# Patient Record
Sex: Female | Born: 1945 | Race: White | Hispanic: No | Marital: Single | Smoking: Never smoker
Health system: Southern US, Community
[De-identification: ages and names within clinical notes are randomized; demographics above are authoritative.]

## PROBLEM LIST (undated history)

## (undated) DIAGNOSIS — N200 Calculus of kidney: Secondary | ICD-10-CM

## (undated) DIAGNOSIS — M797 Fibromyalgia: Secondary | ICD-10-CM

## (undated) DIAGNOSIS — I10 Essential (primary) hypertension: Secondary | ICD-10-CM

## (undated) DIAGNOSIS — K219 Gastro-esophageal reflux disease without esophagitis: Secondary | ICD-10-CM

## (undated) DIAGNOSIS — G231 Progressive supranuclear ophthalmoplegia [Steele-Richardson-Olszewski]: Secondary | ICD-10-CM

## (undated) HISTORY — DX: Essential (primary) hypertension: I10

## (undated) HISTORY — DX: Fibromyalgia: M79.7

## (undated) HISTORY — PX: ROTATOR CUFF REPAIR: SHX139

## (undated) HISTORY — DX: Calculus of kidney: N20.0

## (undated) HISTORY — DX: Gastro-esophageal reflux disease without esophagitis: K21.9

---

## 1980-05-08 DIAGNOSIS — N200 Calculus of kidney: Secondary | ICD-10-CM

## 1980-05-08 HISTORY — DX: Calculus of kidney: N20.0

## 1996-05-08 HISTORY — PX: NOSE SURGERY: SHX723

## 1998-05-08 HISTORY — PX: OTHER SURGICAL HISTORY: SHX169

## 2009-05-08 HISTORY — PX: OVARIAN CYST SURGERY: SHX726

## 2014-02-09 ENCOUNTER — Other Ambulatory Visit (HOSPITAL_COMMUNITY): Payer: Self-pay | Admitting: *Deleted

## 2014-02-09 DIAGNOSIS — M810 Age-related osteoporosis without current pathological fracture: Secondary | ICD-10-CM

## 2014-02-10 ENCOUNTER — Other Ambulatory Visit (HOSPITAL_COMMUNITY): Payer: Self-pay | Admitting: Family Medicine

## 2014-02-10 DIAGNOSIS — M810 Age-related osteoporosis without current pathological fracture: Secondary | ICD-10-CM

## 2014-02-11 ENCOUNTER — Ambulatory Visit (INDEPENDENT_AMBULATORY_CARE_PROVIDER_SITE_OTHER): Payer: Medicare PPO | Admitting: Family Medicine

## 2014-02-11 VITALS — BP 140/80 | HR 80 | Temp 98.1°F | Resp 16 | Ht 67.0 in | Wt 211.0 lb

## 2014-02-11 DIAGNOSIS — W19XXXA Unspecified fall, initial encounter: Secondary | ICD-10-CM

## 2014-02-11 DIAGNOSIS — Z23 Encounter for immunization: Secondary | ICD-10-CM

## 2014-02-11 DIAGNOSIS — S0181XA Laceration without foreign body of other part of head, initial encounter: Secondary | ICD-10-CM

## 2014-02-11 DIAGNOSIS — Y92009 Unspecified place in unspecified non-institutional (private) residence as the place of occurrence of the external cause: Secondary | ICD-10-CM

## 2014-02-11 DIAGNOSIS — Y92099 Unspecified place in other non-institutional residence as the place of occurrence of the external cause: Secondary | ICD-10-CM

## 2014-02-11 NOTE — Progress Notes (Signed)
The patient was anesthetized with 5 cc of 2% with epi.  The wound was vigorously scrubbed with soap and water.  The wound was then closed with four 6-0 simple interrupted sutures.  The wound was then cleaned with water and a bandage was applied. Patient instructed to come back for removal in 5 days.

## 2014-02-11 NOTE — Progress Notes (Signed)
Subjective: Patient tripped at about 11:00 today with her foot off the 8 to the pavement. She fell forward hitting the pavement. She has a laceration over her left eyebrow, primarily caused by the glasses. She has an abrasion on the forehead. She also has a little cut on her nose. No loss of consciousness.  Last tetanus shot was many years ago.  Objective: No major distress. Abrasion on forehead. 2 CM laceration above the medial aspect of the left eyebrow. 4-5 mm laceration on the bridge of the nose. No other injuries.  Assessment: Facial wounds  Plan: TDAP Laceration repair.

## 2014-02-11 NOTE — Patient Instructions (Signed)

## 2014-02-18 ENCOUNTER — Ambulatory Visit (INDEPENDENT_AMBULATORY_CARE_PROVIDER_SITE_OTHER): Payer: Medicare PPO | Admitting: Emergency Medicine

## 2014-02-18 VITALS — BP 130/72 | HR 76 | Temp 98.8°F | Resp 16

## 2014-02-18 DIAGNOSIS — Z4802 Encounter for removal of sutures: Secondary | ICD-10-CM

## 2014-02-18 DIAGNOSIS — S0181XD Laceration without foreign body of other part of head, subsequent encounter: Secondary | ICD-10-CM

## 2014-02-18 NOTE — Patient Instructions (Signed)

## 2014-02-18 NOTE — Progress Notes (Signed)
   Subjective:    Patient ID: Heather Brady, female    DOB: 1945-07-12, 68 y.o.   MRN: [id number]  HPI  This is a 68 year old female here for suture removal. She sustained a laceration to her face 7 days ago. She is not having any problems - no dizziness, blurred vision, excessive pain or drainage.  Review of Systems  Constitutional: Negative.   Eyes: Negative for visual disturbance.  Skin: Positive for wound.  Neurological: Negative for dizziness, syncope and headaches.       Objective:   Physical Exam  Constitutional: She is oriented to person, place, and time. She appears well-developed and well-nourished. No distress.  HENT:  Head: Normocephalic.    Abrasion over nose and a healed laceration about left brow.  Eyes: Conjunctivae, EOM and lids are normal. Right eye exhibits no discharge. Left eye exhibits no discharge. No scleral icterus.  Pulmonary/Chest: Effort normal. No respiratory distress.  Neurological: She is alert and oriented to person, place, and time.  Skin: Skin is warm and dry. Laceration noted. She is not diaphoretic.  2 cm healed laceration above right brow.  Psychiatric: She has a normal mood and affect. Her speech is normal and behavior is normal. Thought content normal.    Procedure: wound is healing well. #4 sutures removed. Wound care discussed.      Assessment & Plan:  1. Laceration of face, subsequent encounter Wound healing well. #4 sutures removed. Wound care discussed. She will follow up PRN.   Roswell MinersNicole V. Dyke BrackettBush, PA-C, MHS [facility]  02/18/2014

## 2014-02-19 ENCOUNTER — Other Ambulatory Visit (HOSPITAL_COMMUNITY): Payer: Self-pay | Admitting: Family Medicine

## 2014-02-19 DIAGNOSIS — Z1231 Encounter for screening mammogram for malignant neoplasm of breast: Secondary | ICD-10-CM

## 2014-03-09 ENCOUNTER — Ambulatory Visit (HOSPITAL_COMMUNITY)
Admission: RE | Admit: 2014-03-09 | Discharge: 2014-03-09 | Disposition: A | Discharge status: Home / Self Care | Payer: Medicare PPO | Source: Ambulatory Visit | Attending: *Deleted | Admitting: *Deleted

## 2014-03-09 ENCOUNTER — Ambulatory Visit (HOSPITAL_COMMUNITY)
Admission: RE | Admit: 2014-03-09 | Discharge: 2014-03-09 | Disposition: A | Discharge status: Home / Self Care | Payer: Medicare PPO | Source: Ambulatory Visit | Attending: Family Medicine | Admitting: Family Medicine

## 2014-03-09 DIAGNOSIS — Z13820 Encounter for screening for osteoporosis: Secondary | ICD-10-CM | POA: Insufficient documentation

## 2014-03-09 DIAGNOSIS — Z780 Asymptomatic menopausal state: Secondary | ICD-10-CM | POA: Insufficient documentation

## 2014-03-09 DIAGNOSIS — Z1231 Encounter for screening mammogram for malignant neoplasm of breast: Secondary | ICD-10-CM | POA: Insufficient documentation

## 2014-03-09 DIAGNOSIS — M810 Age-related osteoporosis without current pathological fracture: Secondary | ICD-10-CM

## 2014-03-12 ENCOUNTER — Other Ambulatory Visit: Payer: Self-pay | Admitting: Family Medicine

## 2014-03-12 DIAGNOSIS — R928 Other abnormal and inconclusive findings on diagnostic imaging of breast: Secondary | ICD-10-CM

## 2014-03-25 ENCOUNTER — Other Ambulatory Visit: Payer: Medicare PPO

## 2014-03-26 ENCOUNTER — Ambulatory Visit
Admission: RE | Admit: 2014-03-26 | Discharge: 2014-03-26 | Disposition: A | Discharge status: Home / Self Care | Payer: Medicare PPO | Source: Ambulatory Visit | Attending: Family Medicine | Admitting: Family Medicine

## 2014-03-26 ENCOUNTER — Encounter (INDEPENDENT_AMBULATORY_CARE_PROVIDER_SITE_OTHER): Payer: Self-pay

## 2014-03-26 DIAGNOSIS — R928 Other abnormal and inconclusive findings on diagnostic imaging of breast: Secondary | ICD-10-CM

## 2014-10-08 ENCOUNTER — Other Ambulatory Visit (HOSPITAL_COMMUNITY): Payer: Self-pay | Admitting: Family Medicine

## 2014-10-08 ENCOUNTER — Ambulatory Visit (INDEPENDENT_AMBULATORY_CARE_PROVIDER_SITE_OTHER): Payer: Medicare PPO | Admitting: Emergency Medicine

## 2014-10-08 VITALS — BP 180/100 | HR 86 | Temp 98.4°F | Resp 18 | Ht 66.0 in | Wt 200.0 lb

## 2014-10-08 DIAGNOSIS — R270 Ataxia, unspecified: Secondary | ICD-10-CM | POA: Diagnosis not present

## 2014-10-08 DIAGNOSIS — S0012XA Contusion of left eyelid and periocular area, initial encounter: Secondary | ICD-10-CM

## 2014-10-08 DIAGNOSIS — S0081XA Abrasion of other part of head, initial encounter: Secondary | ICD-10-CM | POA: Diagnosis not present

## 2014-10-08 NOTE — Patient Instructions (Signed)

## 2014-10-08 NOTE — Progress Notes (Signed)
Subjective:  Patient ID: Heather Brady, female    DOB: [birth year]  Age: 69 y.o. MRN: [id number]  CC: Fall and Eye Pain   HPI Heather Brady presents  for evaluation following a fall. She is under evaluation by her family doctor following repeatedly falling at home. Thus far she's only fallen in the presence of other people. On Tuesday she tripped and fell and struck her forehead and left eye on the carpet. She has a periorbital ecchymosis on the left and an abrasion on her left forehead. She had no loss of consciousness. Has no new neurologic or visual symptoms.  She has no improvement with over-the-counter medication. She denies any other injury.  Outpatient Prescriptions Prior to Visit  Medication Sig Dispense Refill  . amLODipine (NORVASC) 10 MG tablet Take 10 mg by mouth daily.    . Ergocalciferol (VITAMIN D2 PO) Take by mouth.    . esomeprazole (NEXIUM) 40 MG capsule Take 40 mg by mouth daily at 12 noon.    Marland Kitchen. HYDROcodone-acetaminophen (NORCO/VICODIN) 5-325 MG per tablet Take 1 tablet by mouth every 6 (six) hours as needed for moderate pain.    Marland Kitchen. losartan (COZAAR) 25 MG tablet Take 25 mg by mouth daily.    Marland Kitchen. rOPINIRole (REQUIP) 2 MG tablet Take 2 mg by mouth at bedtime.     No facility-administered medications prior to visit.    History   Social History  . Marital Status: Single    Spouse Name: N/A  . Number of Children: N/A  . Years of Education: N/A   Social History Main Topics  . Smoking status: Never Smoker   . Smokeless tobacco: Not on file  . Alcohol Use: Not on file  . Drug Use: Not on file  . Sexual Activity: Not on file   Other Topics Concern  . None   Social History Narrative    History reviewed. No pertinent family history.  Past Medical History  Diagnosis Date  . Hypertension   . Fibromyalgia      Review of Systems  Constitutional: Negative for fever, chills and appetite change.  HENT: Negative for congestion, ear pain, postnasal drip, sinus  pressure and sore throat.   Eyes: Negative for pain and redness.  Respiratory: Negative for cough, shortness of breath and wheezing.   Cardiovascular: Negative for leg swelling.  Gastrointestinal: Negative for nausea, vomiting, abdominal pain, diarrhea, constipation and blood in stool.  Endocrine: Negative for polyuria.  Genitourinary: Negative for dysuria, urgency, frequency and flank pain.  Musculoskeletal: Negative for gait problem.  Skin: Negative for rash.  Neurological: Positive for dizziness. Negative for weakness and headaches.  Psychiatric/Behavioral: Negative for confusion and decreased concentration. The patient is not nervous/anxious.     Objective:  BP 180/100 mmHg  Pulse 86  Temp(Src) 98.4 F (36.9 C) (Oral)  Resp 18  Ht 5\' 6"  (1.676 m)  Wt 200 lb (90.719 kg)  BMI 32.30 kg/m2  SpO2 97%  BP Readings from Last 3 Encounters:  10/08/14 180/100  02/18/14 130/72  02/11/14 140/80    Wt Readings from Last 3 Encounters:  10/08/14 200 lb (90.719 kg)  02/11/14 211 lb (95.709 kg)    Physical Exam  Constitutional: She is oriented to person, place, and time. She appears well-developed and well-nourished.  HENT:  Head: Normocephalic. Head is with raccoon's eyes and with abrasion.  Eyes: Conjunctivae are normal. Pupils are equal, round, and reactive to light.  Pulmonary/Chest: Effort normal.  Musculoskeletal: She exhibits no edema.  Neurological: She is alert and oriented to person, place, and time. Coordination abnormal.  Skin: Skin is dry. Abrasion and bruising noted.  Psychiatric: She has a normal mood and affect. Her behavior is normal. Thought content normal.   There is no hyphema. She is ataxic and is unable to do tandem gait satisfactorily. She says this is nothing new but if something that is known from previously.   No results found for: WBC, HGB, HCT, PLT, GLUCOSE, CHOL, TRIG, HDL, LDLDIRECT, LDLCALC, ALT, AST, NA, K, CL, CREATININE, BUN, CO2, TSH, PSA, INR,  GLUF, HGBA1C, MICROALBUR    .  Assessment & Plan:   Heather Brady was seen today for fall and eye pain.  Diagnoses and all orders for this visit:  Abrasion of face, initial encounter  Periorbital ecchymosis, left, initial encounter  Ataxia   I am having Heather Brady maintain her esomeprazole, Ergocalciferol (VITAMIN D2 PO), HYDROcodone-acetaminophen, rOPINIRole, amLODipine, and losartan.  No orders of the defined types were placed in this encounter.    follow-up with her regular doctor and complete the scheduled MRI and referral to neurology.    Appropriate red flag conditions were discussed with the patient as well as actions that should be taken.  Patient expressed his understanding.  Follow-up: Return if symptoms worsen or fail to improve.  Heather Dane, MD

## 2014-10-19 ENCOUNTER — Ambulatory Visit (HOSPITAL_COMMUNITY)
Admission: RE | Admit: 2014-10-19 | Discharge: 2014-10-19 | Disposition: A | Discharge status: Home / Self Care | Payer: Medicare PPO | Source: Ambulatory Visit | Attending: Family Medicine | Admitting: Family Medicine

## 2014-10-19 DIAGNOSIS — W19XXXA Unspecified fall, initial encounter: Secondary | ICD-10-CM | POA: Diagnosis not present

## 2014-10-19 DIAGNOSIS — R270 Ataxia, unspecified: Secondary | ICD-10-CM | POA: Diagnosis present

## 2014-10-19 DIAGNOSIS — G319 Degenerative disease of nervous system, unspecified: Secondary | ICD-10-CM | POA: Insufficient documentation

## 2014-10-19 MED ORDER — GADOBENATE DIMEGLUMINE 529 MG/ML IV SOLN
20.0000 mL | Freq: Once | INTRAVENOUS | Status: AC
  Administered 2014-10-19: 19 mL via INTRAVENOUS

## 2014-10-20 ENCOUNTER — Encounter: Payer: Self-pay | Admitting: Diagnostic Neuroimaging

## 2014-10-20 ENCOUNTER — Ambulatory Visit (INDEPENDENT_AMBULATORY_CARE_PROVIDER_SITE_OTHER): Payer: Medicare PPO | Admitting: Diagnostic Neuroimaging

## 2014-10-20 VITALS — BP 108/67 | HR 83 | Ht 66.0 in | Wt 200.6 lb

## 2014-10-20 DIAGNOSIS — R42 Dizziness and giddiness: Secondary | ICD-10-CM | POA: Diagnosis not present

## 2014-10-20 DIAGNOSIS — R269 Unspecified abnormalities of gait and mobility: Secondary | ICD-10-CM | POA: Diagnosis not present

## 2014-10-20 DIAGNOSIS — G20 Parkinson's disease: Secondary | ICD-10-CM | POA: Diagnosis not present

## 2014-10-20 NOTE — Progress Notes (Signed)
[facility]  PATIENT: Heather Brady DOB: 11-07-1945  REFERRING CLINICIAN: Nymberg  HISTORY FROM: patient  REASON FOR VISIT: new consult    HISTORICAL  CHIEF COMPLAINT:  Chief Complaint  Patient presents with  . ataxia    New Patient, rm 7    HISTORY OF PRESENT ILLNESS:   69 year old right-handed female here for evaluation of gait and balance difficulty. For past 1 year patient has had increasing problems with balance or walking, controlling her feet, resulting in at least 4 falls. Most recent fall was several weeks ago, where she fell forward without warning. She wanted to walk forward. Her feet were stuck in left behind. She struck her left forehead and has left periorbital swelling and ecchymoses.  Patient also has difficulty getting up from a chair. She has been more cautious and has stopped walking as far she used to. In 2012 she was walking 5 miles per day, as well as doing country dancing, and very active. 2014 she decreased her walking to 3 miles per day. By 2015 she had stopped walking for daily exercise. She is very fearful of falling and barely walks at all.  Patient also has intermittent numbness and tingling in hands or feet for past 10 years. 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   REVIEW OF SYSTEMS: Full 14 system review of systems performed and notable only for insomnia restless legs weakness constipation allergies runny nose aching muscles weight gain swelling in legs moles.   ALLERGIES: Allergies  Allergen Reactions  . Amoxicillin Itching    HOME MEDICATIONS: Outpatient Prescriptions Prior to Visit  Medication Sig Dispense Refill  . amLODipine (NORVASC) 10 MG tablet Take 10 mg by mouth daily.    Marland Kitchen esomeprazole (NEXIUM) 40 MG capsule Take 40 mg by mouth daily at 12 noon.    Marland Kitchen HYDROcodone-acetaminophen (NORCO/VICODIN) 5-325 MG per tablet Take 1 tablet by mouth every 6 (six) hours as needed for  moderate pain.    . Ergocalciferol (VITAMIN D2 PO) Take by mouth.    . losartan (COZAAR) 25 MG tablet Take 25 mg by mouth daily.    Marland Kitchen rOPINIRole (REQUIP) 2 MG tablet Take 2 mg by mouth at bedtime.     No facility-administered medications prior to visit.    PAST MEDICAL HISTORY: Past Medical History  Diagnosis Date  . Hypertension   . Fibromyalgia   . GERD (gastroesophageal reflux disease)   . Kidney stones 1982    PAST SURGICAL HISTORY: Past Surgical History  Procedure Laterality Date  . Ovarian cyst surgery  2011  . Nose surgery  1998    reconstruction  . Carpel tunnel  2000    right    FAMILY HISTORY: Family History  Problem Relation Age of Onset  . Healthy Mother   . Healthy Father     SOCIAL HISTORY:  History   Social History  . Marital Status: Single    Spouse Name: N/A  . Number of Children: 2  . Years of Education: 13   Occupational History  . retired     Product/process development scientist   Social History Main Topics  . Smoking status: Never Smoker   . Smokeless tobacco: Not on file  . Alcohol Use: No  . Drug Use: No  . Sexual Activity: Not on file   Other Topics Concern  . Not on file   Social History Narrative   Lives at home alone   Drinks 16 oz tea daily  PHYSICAL EXAM   GENERAL EXAM/CONSTITUTIONAL: Vitals:  Filed Vitals:   10/20/14 1414  BP: 108/67  Pulse: 83  Height:  (1.676 m)  Weight: 200 lb 9.6 oz (90.992 kg)     Body mass index is 32.39 kg/(m^2).  Visual Acuity Screening   Right eye Left eye Both eyes  Without correction:     With correction: 20/70 20/50      Patient is in no distress; well developed, nourished and groomed; neck is supple  CARDIOVASCULAR:  Examination of carotid arteries is normal; no carotid bruits  Regular rate and rhythm, no murmurs  Examination of peripheral vascular system by observation and palpation is normal  EYES:  Ophthalmoscopic exam of optic discs and posterior segments is  normal; no papilledema or hemorrhages  MUSCULOSKELETAL:  Gait, strength, tone, movements noted in Neurologic exam below  NEUROLOGIC: MENTAL STATUS:  No flowsheet data found.  awake, alert, oriented to person, place and time  recent and remote memory intact  normal attention and concentration  language fluent, comprehension intact, naming intact,   fund of knowledge appropriate  CRANIAL NERVE:   2nd - no papilledema on fundoscopic exam  2nd, 3rd, 4th, 6th - pupils equal and reactive to light, visual fields full to confrontation, extraocular muscles intact, no nystagmus  5th - facial sensation symmetric  7th - facial strength symmetric  8th - hearing intact  9th - palate elevates symmetrically, uvula midline  11th - shoulder shrug symmetric  12th - tongue protrusion midline  VOICE HOARSE, SOFT  MOTOR:   normal bulk; INCREASED COGWHEELING RIGIDITY IN BUE WITH CONTRALATERAL REINFORCEMENT; MODERATE BRADYKINESIA (LEFT WORSE THAN RIGHT IN UPPER AND LOWER EXT; LOWER EXT WORSE THAN UPPER EXT; full strength in the BUE, BLE  SENSORY:   normal and symmetric to light touch, pinprick, temperature; VIBRATION 6 SEC AT TOES  COORDINATION:   finger-nose-finger, fine finger movements normal  REFLEXES:   deep tendon reflexes: BUE 3, KNEES 2, RIGHT ANKLE 0, LEFT ANKLE 1; POSITIVE SNOUT; BORDERLINE MYERSONS  GAIT/STATION:   narrow based gait; NEEDS ARMS TO HELP STAND UP; SLOW, SHORT STEPS, UNSTEADY, EN BLOC TURNING; DECR ARM SWING; CANNOT TANDEM, TOE OR HEEL WALK; romberg is negative    DIAGNOSTIC DATA (LABS, IMAGING, TESTING) - I reviewed patient records, labs, notes, testing and imaging myself where available.  No results found for: WBC, HGB, HCT, MCV, PLT No results found for: NA, K, CL, CO2, GLUCOSE, BUN, CREATININE, CALCIUM, PROT, ALBUMIN, AST, ALT, ALKPHOS, BILITOT, GFRNONAA, GFRAA No results found for: CHOL, HDL, LDLCALC, LDLDIRECT, TRIG, CHOLHDL No results found  for: HGBA1C No results found for: VITAMINB12 No results found for: TSH   10/19/14 MRI brain [I reviewed images myself and agree with interpretation. -VRP]  1. No acute intracranial abnormality or mass. 2. Mild chronic small vessel ischemic disease and cerebral atrophy.     ASSESSMENT AND PLAN  69 y.o. year old female here with progressive gait and balance difficulty, with cogwheel rigidity, bradykinesia, masked facies, positive snout and Myerson reflexes. Signs and symptoms are concerning for parkinsonism.   Dx:  Gait difficulty - Plan: MR Cervical Spine Wo Contrast, For home use only DME 4 wheeled rolling walker with seat, Ambulatory referral to Physical Therapy  Parkinsonism - Plan: MR Cervical Spine Wo Contrast, For home use only DME 4 wheeled rolling walker with seat, Ambulatory referral to Physical Therapy  Dizziness and giddiness - Plan: MR Cervical Spine Wo Contrast, For home use only DME 4 wheeled rolling walker  with seat, Ambulatory referral to Physical Therapy    PLAN: - MRI cervical --> rule out cervical myelopathy - rollator walker - PT evaluation - may consider carbidopa/levodopa at next visit  Orders Placed This Encounter  Procedures  . For home use only DME 4 wheeled rolling walker with seat  . MR Cervical Spine Wo Contrast  . Ambulatory referral to Physical Therapy   Return in about 6 weeks (around 12/01/2014).    Suanne Marker, MD 10/20/2014, 3:40 PM Certified in Neurology, Neurophysiology and Neuroimaging  [facility] [address] [place], [place] [address] [phone]

## 2014-10-20 NOTE — Patient Instructions (Signed)
I will check MRI cervical spine.  I will setup physical therapy and rollator walker.

## 2014-11-03 ENCOUNTER — Ambulatory Visit
Admission: RE | Admit: 2014-11-03 | Discharge: 2014-11-03 | Disposition: A | Discharge status: Home / Self Care | Payer: Medicare PPO | Source: Ambulatory Visit | Attending: Diagnostic Neuroimaging | Admitting: Diagnostic Neuroimaging

## 2014-11-03 DIAGNOSIS — R269 Unspecified abnormalities of gait and mobility: Secondary | ICD-10-CM

## 2014-11-03 DIAGNOSIS — G20 Parkinson's disease: Secondary | ICD-10-CM

## 2014-11-03 DIAGNOSIS — R42 Dizziness and giddiness: Secondary | ICD-10-CM

## 2014-11-24 ENCOUNTER — Ambulatory Visit: Payer: Medicare PPO | Admitting: Diagnostic Neuroimaging

## 2014-11-25 ENCOUNTER — Encounter: Payer: Self-pay | Admitting: Diagnostic Neuroimaging

## 2014-11-25 ENCOUNTER — Ambulatory Visit (INDEPENDENT_AMBULATORY_CARE_PROVIDER_SITE_OTHER): Payer: Medicare PPO | Admitting: Diagnostic Neuroimaging

## 2014-11-25 VITALS — BP 139/82 | HR 82 | Ht 66.0 in | Wt 199.8 lb

## 2014-11-25 DIAGNOSIS — M4712 Other spondylosis with myelopathy, cervical region: Secondary | ICD-10-CM

## 2014-11-25 DIAGNOSIS — M47812 Spondylosis without myelopathy or radiculopathy, cervical region: Secondary | ICD-10-CM

## 2014-11-25 MED ORDER — CARBIDOPA-LEVODOPA 25-100 MG PO TABS: 1.0000 | ORAL_TABLET | Freq: Three times a day (TID) | ORAL | Status: DC

## 2014-11-25 NOTE — Progress Notes (Signed)
[facility]  PATIENT: Heather Brady DOB: 06/07/1945  REFERRING CLINICIAN: Nymberg  HISTORY FROM: patient  REASON FOR VISIT: FOLLOW UP   HISTORICAL  CHIEF COMPLAINT:  Chief Complaint  Patient presents with  . Gait difficulty    rm 6, review MRI  . Follow-up    HISTORY OF PRESENT ILLNESS:   UPDATE 11/25/14: Since last visit, doing about the same. MRI c-spine reviewed. Not heard from PT yet. No more falls. Stays at home mainly.  PRIOR HPI (10/20/14): 69 year old right-handed female here for evaluation of gait and balance difficulty. For past 1 year patient has had increasing problems with balance or walking, controlling her feet, resulting in at least 4 falls. Most recent fall was several weeks ago, where she fell forward without warning. She wanted to walk forward. Her feet were stuck in left behind. She struck her left forehead and has left periorbital swelling and ecchymoses. Patient also has difficulty getting up from a chair. She has been more cautious and has stopped walking as far she used to. In 2012 she was walking 5 miles per day, as well as doing country dancing, and very active. 2014 she decreased her walking to 3 miles per day. By 2015 she had stopped walking for daily exercise. She is very fearful of falling and barely walks at all. Patient also has intermittent numbness and tingling in hands or feet for past 10 years. 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   REVIEW OF SYSTEMS: Full 14 system review of systems performed and notable only for excessive thirst constipation walking diff insomnia.     ALLERGIES: Allergies  Allergen Reactions  . Amoxicillin Itching    HOME MEDICATIONS: Outpatient Prescriptions Prior to Visit  Medication Sig Dispense Refill  . amLODipine (NORVASC) 10 MG tablet Take 10 mg by mouth daily.    Marland Kitchen. esomeprazole (NEXIUM) 40 MG capsule Take 40 mg by mouth daily at 12 noon.    Marland Kitchen.  HYDROcodone-acetaminophen (NORCO/VICODIN) 5-325 MG per tablet Take 1 tablet by mouth every 6 (six) hours as needed for moderate pain.    Marland Kitchen. ibuprofen (ADVIL,MOTRIN) 100 MG chewable tablet Chew by mouth every 8 (eight) hours as needed.    Marland Kitchen. levothyroxine (SYNTHROID, LEVOTHROID) 75 MCG tablet     . naproxen sodium (ANAPROX) 220 MG tablet Take 220 mg by mouth 2 (two) times daily with a meal.    . Vitamin D, Ergocalciferol, (DRISDOL) 50000 UNITS CAPS capsule TAKE ONE CAPSULE BY MOUTH EVERY 2 WEEKS  5  . losartan (COZAAR) 50 MG tablet      No facility-administered medications prior to visit.    PAST MEDICAL HISTORY: Past Medical History  Diagnosis Date  . Hypertension   . Fibromyalgia   . GERD (gastroesophageal reflux disease)   . Kidney stones 1982    PAST SURGICAL HISTORY: Past Surgical History  Procedure Laterality Date  . Ovarian cyst surgery  2011  . Nose surgery  1998    reconstruction  . Carpel tunnel  2000    right    FAMILY HISTORY: Family History  Problem Relation Age of Onset  . Healthy Mother   . Healthy Father     SOCIAL HISTORY:  History   Social History  . Marital Status: Single    Spouse Name: N/A  . Number of Children: 2  . Years of Education: 13   Occupational History  . retired     Product/process development scientistcafeteria supervisor   Social History  Main Topics  . Smoking status: Never Smoker   . Smokeless tobacco: Not on file  . Alcohol Use: No  . Drug Use: No  . Sexual Activity: Not on file   Other Topics Concern  . Not on file   Social History Narrative   Lives at home alone   Drinks 16 oz tea daily     PHYSICAL EXAM   GENERAL EXAM/CONSTITUTIONAL: Vitals:  Filed Vitals:   11/25/14 1434  BP: 139/82  Pulse: 82  Height: 5\' 6"  (1.676 m)  Weight: 199 lb 12.8 oz (90.629 kg)   Body mass index is 32.26 kg/(m^2). No exam data present  Patient is in no distress; well developed, nourished and groomed; neck is supple  CARDIOVASCULAR:  Examination of carotid  arteries is normal; no carotid bruits  Regular rate and rhythm, no murmurs  Examination of peripheral vascular system by observation and palpation is normal   MUSCULOSKELETAL:  Gait, strength, tone, movements noted in Neurologic exam below  NEUROLOGIC: MENTAL STATUS:  No flowsheet data found.  awake, alert, oriented to person, place and time  recent and remote memory intact  normal attention and concentration  language fluent, comprehension intact, naming intact,   fund of knowledge appropriate  CRANIAL NERVE:   2nd - no papilledema on fundoscopic exam  2nd, 3rd, 4th, 6th - pupils equal and reactive to light, visual fields full to confrontation, extraocular muscles intact, no nystagmus  5th - facial sensation symmetric  7th - facial strength symmetric  8th - hearing intact  9th - palate elevates symmetrically, uvula midline  11th - shoulder shrug symmetric  12th - tongue protrusion midline  VOICE HOARSE, SOFT  MOTOR:   normal bulk; INCREASED TONE IN BUE WITH CONTRALATERAL REINFORCEMENT; MODERATE BRADYKINESIA (LEFT WORSE THAN RIGHT IN UPPER AND LOWER EXT; LOWER EXT WORSE THAN UPPER EXT; full strength in the BUE, BLE  SENSORY:   normal and symmetric to light touch, pinprick, temperature; VIBRATION 6 SEC AT TOES  COORDINATION:   finger-nose-finger, fine finger movements normal  REFLEXES:   deep tendon reflexes: BUE 3, KNEES 3, ANKLES TRACE; POSITIVE SNOUT; BORDERLINE MYERSONS  GAIT/STATION:   narrow based gait; NEEDS ARMS TO HELP STAND UP; SLOW, SHORT STEPS, UNSTEADY, EN BLOC TURNING; DECR ARM SWING; CANNOT TANDEM, TOE OR HEEL WALK; romberg is negative    DIAGNOSTIC DATA (LABS, IMAGING, TESTING) - I reviewed patient records, labs, notes, testing and imaging myself where available.  No results found for: WBC, HGB, HCT, MCV, PLT No results found for: NA, K, CL, CO2, GLUCOSE, BUN, CREATININE, CALCIUM, PROT, ALBUMIN, AST, ALT, ALKPHOS, BILITOT,  GFRNONAA, GFRAA No results found for: CHOL, HDL, LDLCALC, LDLDIRECT, TRIG, CHOLHDL No results found for: ZOXW9U No results found for: VITAMINB12 No results found for: TSH   10/19/14 MRI brain [I reviewed images myself and agree with interpretation. -VRP]  1. No acute intracranial abnormality or mass. 2. Mild chronic small vessel ischemic disease and cerebral atrophy.  11/03/14 MRI cervical spine (without) [I reviewed images myself and agree with interpretation. -VRP] 1. At C3-4: disc bulging with mild spinal stenosis with moderate right and severe left foraminal stenosis; no cord signal changes 2. At C7-T1: pseudo-disc bulging with mild spinal stenosis, mild right and severe left foraminal stenosis; no cord signal changes 3. At C4-5, C5-6: disc bulging with moderate left foraminal stenosis     ASSESSMENT AND PLAN  69 y.o. year old female here with progressive gait and balance difficulty, with cogwheel rigidity, bradykinesia, masked facies,  positive snout and Myerson reflexes. Signs and symptoms are concerning for parkinsonism. Also with mild spinal stenosis at  C3-4 level, hyper-reflexia in BUE and BLE, and unsteady gait.   Dx: parkinsonism +/- cervical myelopathy   PLAN: - trial of carb/levo - rollator walker - PT evaluation - refer to neurosurgery for cervical myelopathy evaluation  Meds ordered this encounter  Medications  . carbidopa-levodopa (SINEMET IR) 25-100 MG per tablet    Sig: Take 1 tablet by mouth 3 (three) times daily before meals.    Dispense:  90 tablet    Refill:  6   Orders Placed This Encounter  Procedures  . Ambulatory referral to Neurosurgery   Return in about 2 months (around 01/26/2015).    Suanne Marker, MD 11/25/2014, 3:40 PM Certified in Neurology, Neurophysiology and Neuroimaging  [facility] [address], [address] [place], [place] [address] [phone]

## 2014-11-25 NOTE — Patient Instructions (Signed)
Try carbidopa/levo dopa (half tab three times per day with meals x 2 weeks; then increase to 1 tab three times per day).

## 2014-11-26 ENCOUNTER — Telehealth: Payer: Self-pay | Admitting: *Deleted

## 2014-11-26 NOTE — Telephone Encounter (Signed)
Spoke with [facility] re: patient's PT referral. She gave appt of 11/30/14 at 10:15 am. Spoke with patient who stated she had a commitment on that day. Gave her Angie's name, contact number to reschedule. Ms Kjos stated she would call and reschedule herself. She verbalized understanding, appreciation for this call.

## 2014-11-26 NOTE — Telephone Encounter (Signed)
Patient called and stated that someone from our office called her, she did not know who.

## 2014-11-30 ENCOUNTER — Ambulatory Visit: Payer: Medicare PPO | Admitting: Physical Therapy

## 2014-12-02 ENCOUNTER — Telehealth: Payer: Self-pay | Admitting: Diagnostic Neuroimaging

## 2014-12-02 NOTE — Telephone Encounter (Signed)
Spoke with patient who states last Sat she noticed she began having diarrhea twice a day, itching in back/hips but area varies, and nausea. She states the diarrhea is not watery. She states the itching is keeping her awake. She took Benadryl this morning with good relief. She has not taken any other OTC medications.  She is currently taking Sinemet 1/2 tab tid. She states she is supposed to increase to one tab three times a day, but she states she does not see how she can do that.  Informed her this RN will route her concerns to Dr Marjory Lies and call her back tomorrow afternoon with his reply. Also recommended she take Benadryl nightly, and Imodium if she develops watery stools > 3 a day. She verbalized understanding.

## 2014-12-02 NOTE — Telephone Encounter (Signed)
Patient called and stated that she is experiencing some negative side effects from her medication Rx. carbidopa-levodopa (SINEMET IR) 25-100 MG per tablet and would like to speak with the nurse regarding these issues. Please call and advise.

## 2014-12-04 NOTE — Telephone Encounter (Signed)
Per Dr Marjory Lies, spoke with patient and advised her to stop taking Sinemet today. Informed her that hopefully this will allow her issues to resolve, and this RN will call her early next week for update and further instructions if any, per Dr Marjory Lies. She verbalized understanding, agreement.

## 2014-12-07 ENCOUNTER — Encounter: Payer: Self-pay | Admitting: Physical Therapy

## 2014-12-07 ENCOUNTER — Ambulatory Visit: Payer: Medicare PPO | Attending: Diagnostic Neuroimaging | Admitting: Physical Therapy

## 2014-12-07 DIAGNOSIS — R269 Unspecified abnormalities of gait and mobility: Secondary | ICD-10-CM | POA: Diagnosis not present

## 2014-12-08 ENCOUNTER — Telehealth: Payer: Self-pay | Admitting: *Deleted

## 2014-12-08 ENCOUNTER — Encounter: Payer: Self-pay | Admitting: Physical Therapy

## 2014-12-08 NOTE — Telephone Encounter (Signed)
Spoke with patient who states her itching, diarrhea and nausea stopped within 1-2 days of stopping the Sinemet medication. Informed her this RN will let Dr Marjory Lies know and call her back if he has any further instructions, information. She verbalized understanding.

## 2014-12-08 NOTE — Therapy (Signed)
[facility] [address] [place], [place], [address] Phone: [phone]   Fax:  [phone]  Physical Therapy Evaluation  Patient Details  Name: Heather Brady MRN: [id number] Date of Birth: June 08, 1945 Referring Provider:  Suanne Marker, MD  Encounter Date: 12/07/2014      PT End of Session - 12/08/14 2045    Visit Number 1  G1   Number of Visits 9   Date for PT Re-Evaluation 01/07/15   Authorization Type [employer]   PT Start Time 1402   PT Stop Time 1446   PT Time Calculation (min) 44 min      Past Medical History  Diagnosis Date  . Hypertension   . Fibromyalgia   . GERD (gastroesophageal reflux disease)   . Kidney stones 1982    Past Surgical History  Procedure Laterality Date  . Ovarian cyst surgery  2011  . Nose surgery  1998    reconstruction  . Carpel tunnel  2000    right    There were no vitals filed for this visit.  Visit Diagnosis:  Abnormality of gait - Plan: PT plan of care cert/re-cert      Subjective Assessment - 12/08/14 2019    Subjective Pt. reports gait difficulties started about a year ago with progressive worsening of balance, difficulty with step negotiation and loss of strength in legs compared to status in 2013 when she was working full-time as Youth worker in a school and walking 1 1/2 miles to 3 miles per day. Pt. reports that she feels if she could increase strength in her legs that her status would improve                                                                                                                                              Pertinent History cervical spondylosis recently diagnosed; fibromyalgia, HTN, acid reflux   How long can you sit comfortably? 30 mins   How long can you stand comfortably? 1 1/2 hrs   How long can you walk comfortably? 45 mins   Patient Stated Goals Improve walking and balance   Currently in Pain? Yes   Pain Score 5     Pain Orientation Other (Comment)  all over body   Pain Descriptors / Indicators Burning   Pain Type Chronic pain   Pain Onset More than a month ago   Pain Frequency Constant   Aggravating Factors  no specific factors reported   Pain Relieving Factors Hydrocodone and Alleve            [facility] PT Assessment - 12/08/14 0001    Assessment   Medical Diagnosis Parkinsonism;  Gait abnormality   Onset Date/Surgical Date --  June 2016 for fall: 2015 - saw Dr. Marjory Lies on 11-25-14   Prior Therapy none   Balance Screen  Has the patient fallen in the past 6 months Yes   How many times? 3   Has the patient had a decrease in activity level because of a fear of falling?  Yes   Is the patient reluctant to leave their home because of a fear of falling?  No   Home Environment   Living Environment Private residence   Type of Home House   Home Access Stairs to enter  2   Entrance Stairs-Number of Steps 2   Entrance Stairs-Rails None   Home Layout Multi-level;Able to live on main level with bedroom/bathroom  split level den;    [employer] --  none   Prior Function   Level of Independence Independent with community mobility without device;Independent with homemaking with ambulation;Independent with gait;Independent with transfers   ROM / Strength   AROM / PROM / Strength AROM;Strength   AROM   Overall AROM  Within functional limits for tasks performed   Strength   Overall Strength Within functional limits for tasks performed   Transfers   Transfers Sit to Stand   Sit to Stand With upper extremity assist;6: Modified independent (Device/Increase time)   Five time sit to stand comments  unable to stand without arm rests   Ambulation/Gait   Ambulation/Gait Yes   Ambulation/Gait Assistance 6: Modified independent (Device/Increase time)   Ambulation Distance (Feet) 100 Feet   Assistive device None   Gait Pattern Step-through pattern;Decreased step length - right;Decreased step length  - left;Decreased stride length;Trunk flexed  decreased initial heel contact bil. LE's   Ambulation Surface Level;Indoor   Gait velocity 3.28   10.00 secs   Stairs Yes   Stairs Assistance 5: Supervision   Stair Management Technique Two rails   Number of Stairs 4   Height of Stairs 6   Ramp 5: Supervision   Berg Balance Test   Sit to Stand Able to stand  independently using hands   Standing Unsupported Able to stand safely 2 minutes   Sitting with Back Unsupported but Feet Supported on Floor or Stool Able to sit safely and securely 2 minutes   Stand to Sit Controls descent by using hands   Transfers Able to transfer safely, definite need of hands   Standing Unsupported with Eyes Closed Able to stand 10 seconds with supervision   Standing Ubsupported with Feet Together Able to place feet together independently and stand for 1 minute with supervision   From Standing, Reach Forward with Outstretched Arm Can reach confidently >25 cm (10")   From Standing Position, Pick up Object from Floor Able to pick up shoe safely and easily   From Standing Position, Turn to Look Behind Over each Shoulder Looks behind from both sides and weight shifts well   Turn 360 Degrees Able to turn 360 degrees safely but slowly   Standing Unsupported, Alternately Place Feet on Step/Stool Able to complete 4 steps without aid or supervision   Standing Unsupported, One Foot in Front Able to take small step independently and hold 30 seconds   Standing on One Leg Tries to lift leg/unable to hold 3 seconds but remains standing independently   Total Score 42   Timed Up and Go Test   Normal TUG (seconds) 12.56      Pt denies dizziness at this time                          PT Long Term Goals - 12/08/14 2114  PT LONG TERM GOAL #1   Title Improve Berg score to >/= 47/56 to reduce fall risk.  (01-07-15)   Baseline 42/56   Time 4   Period Weeks   Status New   PT LONG TERM GOAL #2   Title Incr.  gait velocity to >/= 3.6 ft/sec without device for incr. gait efficiency.  (01-07-15)   Baseline 3.28 ft/sec   Time 4   Period Weeks   Status New   PT LONG TERM GOAL #3   Title Amb. 600' without device modified independently without LOB for incr. safety with community amb.  (01-07-15)   Time 4   Period Weeks   Status New   PT LONG TERM GOAL #4   Title Independent in HEP for balance and strengthening exercises.  (01-07-15)   Time 4   Period Weeks   Status New               Plan - 12/08/14 2045/09/15    Clinical Impression Statement Pt. presents with gait deviations due to Parkinsonism including decr.step and stride length with incr. trunk flexion and decr. arm swing; pt also presents with decr. high level balance skills and greater dynamic standing balance deficits than static standing balance                                                                        Pt will benefit from skilled therapeutic intervention in order to improve on the following deficits Abnormal gait;Decreased activity tolerance;Decreased balance;Decreased mobility;Decreased strength;Decreased coordination;Pain;Postural dysfunction   Rehab Potential Good   PT Frequency 2x / week   PT Duration 4 weeks   PT Treatment/Interventions ADLs/Self Care Home Management;Functional mobility training;Stair training;Gait training;Therapeutic activities;Therapeutic exercise;Balance training;Neuromuscular re-education;Patient/family education   PT Next Visit Plan do Dynamic gait index; begin balance HEP; gait train (no device)   PT Home Exercise Plan Balance HEP   Consulted and Agree with Plan of Care Patient          G-Codes - 2014/12/09 09-15-2117    Functional Assessment Tool Used Berg score 42/56;  TUG score 12.56 secs;   Gait velocity 3.28 secs   Functional Limitation Mobility: Walking and moving around   Mobility: Walking and Moving Around Current Status [phone]) At least 20 percent but less than 40 percent impaired, limited or  restricted   Mobility: Walking and Moving Around Goal Status [phone]) At least 1 percent but less than 20 percent impaired, limited or restricted       Problem List Patient Active Problem List   Diagnosis Date Noted  . Gait difficulty 10/20/2014  . Parkinsonism 10/20/2014  . Dizziness and giddiness 10/20/2014    DildayDonavan Burnet, PT 12/08/2014, 9:24 PM  [facility] [address] [place], [place], [address] Phone: [phone]   Fax:  [phone]

## 2014-12-08 NOTE — Telephone Encounter (Signed)
Continue PT and neurosurg eval.

## 2014-12-09 ENCOUNTER — Encounter: Payer: Self-pay | Admitting: *Deleted

## 2015-01-07 ENCOUNTER — Ambulatory Visit: Payer: Medicare PPO | Attending: Diagnostic Neuroimaging | Admitting: Physical Therapy

## 2015-01-07 DIAGNOSIS — R269 Unspecified abnormalities of gait and mobility: Secondary | ICD-10-CM

## 2015-01-07 DIAGNOSIS — R29898 Other symptoms and signs involving the musculoskeletal system: Secondary | ICD-10-CM | POA: Diagnosis present

## 2015-01-08 ENCOUNTER — Encounter: Payer: Self-pay | Admitting: Physical Therapy

## 2015-01-08 NOTE — Patient Instructions (Signed)
SINGLE LIMB STANCE   Stance: single leg on floor. Raise leg. Hold 10___ seconds. Repeat with other leg. _2__ reps per set, _2__ sets per day, 5  days per week  Copyright  VHI. All rights reserved.  Hip Abduction (Standing)   Stand with support. Squeeze pelvic floor and hold. Lift right leg out to side, keeping toe forward. Hold for 2___ seconds. Relax for 2___ seconds.  Repeat __10 _ times. Do _1-2__ times a day. Repeat with other leg. .   Copyright  VHI. All rights reserved.  Flexion and Extension - Standing   Stand and support self while swinging uninvolved leg and hip forward and backward _10__ times. Repeat with involved leg and hip. Do __2_ times per day.  Copyright  VHI. All rights reserved.

## 2015-01-08 NOTE — Therapy (Signed)
[facility] [address] [place], [place], [address] Phone: [phone]   Fax:  [phone]  Physical Therapy Treatment  Patient Details  Name: Heather Brady MRN: [id number] Date of Birth: 02-07-1946 Referring Provider:  Lanice Schwab, MD  Encounter Date: 01/07/2015      PT End of Session - 01/08/15 2102    Visit Number 2   Date for PT Re-Evaluation 02/05/15   Authorization Type [employer]   PT Start Time 1318   PT Stop Time 1402   PT Time Calculation (min) 44 min      Past Medical History  Diagnosis Date  . Hypertension   . Fibromyalgia   . GERD (gastroesophageal reflux disease)   . Kidney stones 1982    Past Surgical History  Procedure Laterality Date  . Ovarian cyst surgery  2011  . Nose surgery  1998    reconstruction  . Carpel tunnel  2000    right    There were no vitals filed for this visit.  Visit Diagnosis:  Abnormality of gait  Bilateral leg weakness      Subjective Assessment - 01/08/15 2054    Subjective Pt. reports no changes since initial evaluaiton   Pertinent History cervical spondylosis recently diagnosed; fibromyalgia, HTN, acid reflux   Patient Stated Goals Improve walking and balance   Currently in Pain? No/denies                         [facility] Adult PT Treatment/Exercise - 01/08/15 0001    Ambulation/Gait   Ambulation/Gait Yes   Ambulation/Gait Assistance 6: Modified independent (Device/Increase time)   Ambulation Distance (Feet) 120 Feet   Assistive device None   Gait Pattern Step-through pattern;Decreased step length - right;Decreased step length - left;Decreased stride length;Trunk flexed  decreased initial heel contact bil. LE's   Ambulation Surface Level;Indoor   Stairs Yes   Stairs Assistance 5: Supervision   Stair Management Technique Two rails;Alternating pattern;Forwards   Number of Stairs 4   Height of Stairs 6              Balance Exercises - 01/08/15 2057    Balance Exercises: Standing   Standing Eyes Opened Narrow base of support (BOS);Head turns   Standing Eyes Closed Wide (BOA);Solid surface   Tandem Stance Eyes open;Intermittent upper extremity support;1 rep;20 secs   SLS Eyes open;Solid surface;1 rep;10 secs;Intermittent upper extremity support   Rockerboard Anterior/posterior;EO;30 seconds;Intermittent UE support   Step Ups Forward;6 inch;UE support 2  10 reps each LE   Sidestepping 2 reps  10' along counter   Heel Raises Limitations 10 reps each leg   Other Standing Exercises Marching in place 10 reps each leg; crossovers and stepping behiand 10' x 2 reps each with UE support prn  cone taps with mod to min assist with LOB     TherEx:  Heel raises x 10 reps bil. LE's in standing               Nustep Level 4 x 5" with UE's and LE's           PT Long Term Goals - 12/08/14 2114    PT LONG TERM GOAL #1   Title Improve Berg score to >/= 47/56 to reduce fall risk.  (01-07-15)   Baseline 42/56   Time 4   Period Weeks   Status New   PT LONG TERM GOAL #2   Title  Incr. gait velocity to >/= 3.6 ft/sec without device for incr. gait efficiency.  (01-07-15)   Baseline 3.28 ft/sec   Time 4   Period Weeks   Status New   PT LONG TERM GOAL #3   Title Amb. 600' without device modified independently without LOB for incr. safety with community amb.  (01-07-15)   Time 4   Period Weeks   Status New   PT LONG TERM GOAL #4   Title Independent in HEP for balance and strengthening exercises.  (01-07-15)   Time 4   Period Weeks   Status New               Plan - 01/08/15 2103    Clinical Impression Statement Pt. has significantly decr. single limb stance and decr. high level balance skills; needs UE support for safety with standing balance exercises   Pt will benefit from skilled therapeutic intervention in order to improve on the following deficits Abnormal gait;Decreased activity tolerance;Decreased  balance;Decreased mobility;Decreased strength;Decreased coordination;Pain;Postural dysfunction   Rehab Potential Good   PT Frequency 2x / week   PT Duration 4 weeks   PT Treatment/Interventions ADLs/Self Care Home Management;Functional mobility training;Stair training;Gait training;Therapeutic activities;Therapeutic exercise;Balance training;Neuromuscular re-education;Patient/family education   PT Next Visit Plan check balance HEP and add exercises as appropriate; cont gait and balance   PT Home Exercise Plan Balance HEP   Consulted and Agree with Plan of Care Patient        Problem List Patient Active Problem List   Diagnosis Date Noted  . Gait difficulty 10/20/2014  . Parkinsonism 10/20/2014  . Dizziness and giddiness 10/20/2014    DildayDonavan Burnet, PT 01/08/2015, 9:07 PM  [facility] [address] [place], [place], [address] Phone: [phone]   Fax:  [phone]

## 2015-01-18 ENCOUNTER — Ambulatory Visit: Payer: Medicare PPO | Admitting: Physical Therapy

## 2015-01-18 DIAGNOSIS — R269 Unspecified abnormalities of gait and mobility: Secondary | ICD-10-CM | POA: Diagnosis not present

## 2015-01-18 DIAGNOSIS — R29898 Other symptoms and signs involving the musculoskeletal system: Secondary | ICD-10-CM

## 2015-01-19 ENCOUNTER — Encounter: Payer: Self-pay | Admitting: Physical Therapy

## 2015-01-19 NOTE — Therapy (Signed)
[facility] [address] [place], [place], [address] Phone: [phone]   Fax:  [phone]  Physical Therapy Treatment  Patient Details  Name: Heather Brady MRN: [id number] Date of Birth: 22-Oct-1945 Referring Provider:  Lanice Schwab, MD  Encounter Date: 01/18/2015      PT End of Session - 01/19/15 2121    Visit Number 3   Number of Visits 9   Authorization Type [employer]   Authorization Time Period til 01-21-15   Authorization - Visit Number 3   Authorization - Number of Visits 6   PT Start Time 1317   PT Stop Time 1401   PT Time Calculation (min) 44 min   Equipment Utilized During Treatment Gait belt      Past Medical History  Diagnosis Date  . Hypertension   . Fibromyalgia   . GERD (gastroesophageal reflux disease)   . Kidney stones 1982    Past Surgical History  Procedure Laterality Date  . Ovarian cyst surgery  2011  . Nose surgery  1998    reconstruction  . Carpel tunnel  2000    right    There were no vitals filed for this visit.  Visit Diagnosis:  Abnormality of gait  Bilateral leg weakness      Subjective Assessment - 01/19/15 2110    Subjective Pt reports soreness from doing exercises alot at home - "trying to get better"   Pertinent History cervical spondylosis recently diagnosed; fibromyalgia, HTN, acid reflux   Patient Stated Goals Improve walking and balance   Currently in Pain? No/denies                              Balance Exercises - 01/19/15 2114    Balance Exercises: Standing   Standing Eyes Closed Wide (BOA);Solid surface;1 rep   SLS Eyes open   Rockerboard Anterior/posterior;Lateral;EO;30 seconds;Intermittent UE support   Gait with Head Turns Forward;1 rep  20'   Sidestepping 2 reps   Cone Rotation Solid surface;Intermittent upper extremity assist   Other Standing Exercises cone taps with mod to min assist with LOB  alternate  stepping on incline with min assist     alternate tap ups with each foot to 6" step x 10 reps each; Sit to stand on AirEx foam with UE support prn with CGA x 5 reps Marching on foam with min assist with LOB  Stepping over and back of 1/2 bolster on floor without UE support with CGA  step ups 10 reps RLE and LLE for strengthening and balance with UE support          PT Long Term Goals - 12/08/14 2114    PT LONG TERM GOAL #1   Title Improve Berg score to >/= 47/56 to reduce fall risk.  (01-07-15)   Baseline 42/56   Time 4   Period Weeks   Status New   PT LONG TERM GOAL #2   Title Incr. gait velocity to >/= 3.6 ft/sec without device for incr. gait efficiency.  (01-07-15)   Baseline 3.28 ft/sec   Time 4   Period Weeks   Status New   PT LONG TERM GOAL #3   Title Amb. 600' without device modified independently without LOB for incr. safety with community amb.  (01-07-15)   Time 4   Period Weeks   Status New   PT LONG TERM GOAL #4   Title Independent in  HEP for balance and strengthening exercises.  (01-07-15)   Time 4   Period Weeks   Status New               Plan - 01/19/15 2122    Clinical Impression Statement Single limb stance improving with practice and repetition but pt cont to need UE support for safety   Pt will benefit from skilled therapeutic intervention in order to improve on the following deficits Abnormal gait;Decreased activity tolerance;Decreased balance;Decreased mobility;Decreased strength;Decreased coordination;Pain;Postural dysfunction   Rehab Potential Good   PT Frequency 2x / week   PT Duration 4 weeks   PT Treatment/Interventions ADLs/Self Care Home Management;Functional mobility training;Stair training;Gait training;Therapeutic activities;Therapeutic exercise;Balance training;Neuromuscular re-education;Patient/family education   PT Next Visit Plan cont balance  and gait   PT Home Exercise Plan Balance HEP   Consulted and Agree with Plan of Care Patient         Problem List Patient Active Problem List   Diagnosis Date Noted  . Gait difficulty 10/20/2014  . Parkinsonism 10/20/2014  . Dizziness and giddiness 10/20/2014    Kary Kos, PT 01/19/2015, 9:29 PM  [facility] [address] [place], [place], [address] Phone: [phone]   Fax:  [phone]

## 2015-01-20 ENCOUNTER — Ambulatory Visit: Payer: Medicare PPO | Admitting: Physical Therapy

## 2015-01-20 DIAGNOSIS — R29898 Other symptoms and signs involving the musculoskeletal system: Secondary | ICD-10-CM

## 2015-01-20 DIAGNOSIS — R269 Unspecified abnormalities of gait and mobility: Secondary | ICD-10-CM | POA: Diagnosis not present

## 2015-01-22 ENCOUNTER — Encounter: Payer: Self-pay | Admitting: Physical Therapy

## 2015-01-22 NOTE — Therapy (Signed)
[facility] [address] [place], [place], [address] Phone: [phone]   Fax:  [phone]  Physical Therapy Treatment  Patient Details  Name: Heather Brady MRN: [id number] Date of Birth: [birth year] Referring Provider:  Lanice Schwab, MD  Encounter Date: 01/20/2015      PT End of Session - 01/22/15 1027    Visit Number 4   Number of Visits 9  6 authorized by [facility]   Authorization Type [employer]   Authorization Time Period til 01-21-15   Authorization - Visit Number 4   Authorization - Number of Visits 6   PT Start Time 1101   PT Stop Time 1146   PT Time Calculation (min) 45 min   Equipment Utilized During Treatment Gait belt      Past Medical History  Diagnosis Date  . Hypertension   . Fibromyalgia   . GERD (gastroesophageal reflux disease)   . Kidney stones 1982    Past Surgical History  Procedure Laterality Date  . Ovarian cyst surgery  2011  . Nose surgery  1998    reconstruction  . Carpel tunnel  2000    right    There were no vitals filed for this visit.  Visit Diagnosis:  Abnormality of gait  Bilateral leg weakness      Subjective Assessment - 01/22/15 1023    Subjective Pt. reports balance is better - not as sore today as she was on Monday   Pertinent History cervical spondylosis recently diagnosed; fibromyalgia, HTN, acid reflux   Patient Stated Goals Improve walking and balance   Currently in Pain? No/denies                         [facility] Adult PT Treatment/Exercise - 01/22/15 0001    Transfers   Transfers Sit to Stand   Sit to Stand With upper extremity assist;6: Modified independent (Device/Increase time)   Five time sit to stand comments  unable to stand without arm rests   Ambulation/Gait   Ambulation/Gait Yes   Ambulation/Gait Assistance 5: Supervision   Ambulation/Gait Assistance Details 400  with use of 1 walking pole for assist. with amb. on  uneven   Assistive device Other (Comment)  walking pole   Gait Pattern Step-through pattern;Decreased step length - right;Decreased step length - left;Decreased stride length;Trunk flexed  decreased initial heel contact bil. LE's   Ambulation Surface Unlevel;Outdoor;Paved   Stairs Yes   Stairs Assistance 5: Supervision   Stair Management Technique Two rails;Alternating pattern;Forwards   Number of Stairs 4   Height of Stairs 6   Ramp 6: Modified independent (Device)   Curb 6: Modified independent (Device/increase time)     TherEx; leg press 60# bil. LE's 3 sets 10 reps;  Nustep level 3 x 5"        Balance Exercises - 01/22/15 1026    Balance Exercises: Standing   Standing Eyes Opened Narrow base of support (BOS);Head turns   Tandem Stance Eyes open;Intermittent upper extremity support;1 rep;20 secs   SLS Eyes open   Rockerboard Anterior/posterior;Lateral;EO;30 seconds;Intermittent UE support   Step Ups Forward;6 inch;UE support 2  10 reps each LE   Sidestepping 2 reps   Heel Raises Limitations 10 reps each leg           PT Education - 01/22/15 1026    Education provided Yes   Education Details gave pt information on [facility] for classes and  use of gym   Person(s) Educated Patient   Methods Explanation;Demonstration;Handout   Comprehension Verbalized understanding             PT Long Term Goals - 12/08/14 2114    PT LONG TERM GOAL #1   Title Improve Berg score to >/= 47/56 to reduce fall risk.  (01-07-15)   Baseline 42/56   Time 4   Period Weeks   Status New   PT LONG TERM GOAL #2   Title Incr. gait velocity to >/= 3.6 ft/sec without device for incr. gait efficiency.  (01-07-15)   Baseline 3.28 ft/sec   Time 4   Period Weeks   Status New   PT LONG TERM GOAL #3   Title Amb. 600' without device modified independently without LOB for incr. safety with community amb.  (01-07-15)   Time 4   Period Weeks   Status New   PT LONG TERM GOAL #4   Title  Independent in HEP for balance and strengthening exercises.  (01-07-15)   Time 4   Period Weeks   Status New               Plan - 01/22/15 1028    Clinical Impression Statement Pt progressing well towards LTG's - gait is safer with use of walking pole (pt prefers this rahter than cane)  for assist. with amb. on uneven surfaces; request additional visits from [facility] to further progress balance and gait                 Pt will benefit from skilled therapeutic intervention in order to improve on the following deficits Abnormal gait;Decreased activity tolerance;Decreased balance;Decreased mobility;Decreased strength;Decreased coordination;Pain;Postural dysfunction   Rehab Potential Good   PT Frequency 2x / week   PT Duration 4 weeks   PT Treatment/Interventions ADLs/Self Care Home Management;Functional mobility training;Stair training;Gait training;Therapeutic activities;Therapeutic exercise;Balance training;Neuromuscular re-education;Patient/family education   PT Next Visit Plan hold til date extension for remaining 2 visits is received; cont balance and gait training   PT Home Exercise Plan Balance HEP   Recommended Other Services use of [facility] for exercise   Consulted and Agree with Plan of Care Patient        Problem List Patient Active Problem List   Diagnosis Date Noted  . Gait difficulty 10/20/2014  . Parkinsonism 10/20/2014  . Dizziness and giddiness 10/20/2014    Kary Kos, PT 01/22/2015, 10:32 AM  [facility] [address] [place], [place], [address] Phone: [phone]   Fax:  [phone]

## 2015-01-27 ENCOUNTER — Ambulatory Visit: Payer: Medicare PPO | Admitting: Diagnostic Neuroimaging

## 2015-02-01 ENCOUNTER — Encounter: Payer: Self-pay | Admitting: Diagnostic Neuroimaging

## 2015-02-01 ENCOUNTER — Ambulatory Visit (INDEPENDENT_AMBULATORY_CARE_PROVIDER_SITE_OTHER): Payer: Medicare PPO | Admitting: Diagnostic Neuroimaging

## 2015-02-01 ENCOUNTER — Ambulatory Visit: Payer: Medicare PPO | Admitting: Diagnostic Neuroimaging

## 2015-02-01 VITALS — BP 120/84 | HR 78 | Ht 66.0 in | Wt 194.0 lb

## 2015-02-01 DIAGNOSIS — R269 Unspecified abnormalities of gait and mobility: Secondary | ICD-10-CM

## 2015-02-01 DIAGNOSIS — G20 Parkinson's disease: Secondary | ICD-10-CM | POA: Diagnosis not present

## 2015-02-01 DIAGNOSIS — M4712 Other spondylosis with myelopathy, cervical region: Secondary | ICD-10-CM

## 2015-02-01 NOTE — Progress Notes (Signed)
[facility]  PATIENT: Heather Brady DOB: 04/15/1946  REFERRING CLINICIAN: Nymberg  HISTORY FROM: patient  REASON FOR VISIT: FOLLOW UP   HISTORICAL  CHIEF COMPLAINT:  Chief Complaint  Patient presents with  . Cervical spondylosis    rm 7  . Follow-up    2 month    HISTORY OF PRESENT ILLNESS:   UPDATE 02/01/15: Since last visit, doing better with PT. No falls. Legs stronger. Never heard from neurosurg clinic. Overall stable to slightly improved.   UPDATE 11/25/14: Since last visit, doing about the same. MRI c-spine reviewed. Not heard from PT yet. No more falls. Stays at home mainly.  PRIOR HPI (10/20/14): 69 year old right-handed female here for evaluation of gait and balance difficulty. For past 1 year patient has had increasing problems with balance or walking, controlling her feet, resulting in at least 4 falls. Most recent fall was several weeks ago, where she fell forward without warning. She wanted to walk forward. Her feet were stuck in left behind. She struck her left forehead and has left periorbital swelling and ecchymoses. Patient also has difficulty getting up from a chair. She has been more cautious and has stopped walking as far she used to. In 2012 she was walking 5 miles per day, as well as doing country dancing, and very active. 2014 she decreased her walking to 3 miles per day. By 2015 she had stopped walking for daily exercise. She is very fearful of falling and barely walks at all. Patient also has intermittent numbness and tingling in hands or feet for past 10 years. 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   REVIEW OF SYSTEMS: Full 14 system review of systems performed and notable only for excessive thirst constipation walking diff insomnia.     ALLERGIES: Allergies  Allergen Reactions  . Amoxicillin Itching  . Carbidopa-Levodopa Itching    HOME MEDICATIONS: Outpatient Prescriptions Prior to Visit   Medication Sig Dispense Refill  . amLODipine (NORVASC) 10 MG tablet Take 10 mg by mouth daily.    Marland Kitchen esomeprazole (NEXIUM) 40 MG capsule Take 40 mg by mouth daily at 12 noon.    Marland Kitchen HYDROcodone-acetaminophen (NORCO/VICODIN) 5-325 MG per tablet Take 1 tablet by mouth every 6 (six) hours as needed for moderate pain.    Marland Kitchen ibuprofen (ADVIL,MOTRIN) 100 MG chewable tablet Chew by mouth every 8 (eight) hours as needed.    Marland Kitchen levothyroxine (SYNTHROID, LEVOTHROID) 75 MCG tablet     . losartan (COZAAR) 50 MG tablet     . Vitamin D, Ergocalciferol, (DRISDOL) 50000 UNITS CAPS capsule TAKE ONE CAPSULE BY MOUTH EVERY 2 WEEKS  5  . carbidopa-levodopa (SINEMET IR) 25-100 MG per tablet Take 1 tablet by mouth 3 (three) times daily before meals. (Patient not taking: Reported on 12/09/2014) 90 tablet 6  . naproxen sodium (ANAPROX) 220 MG tablet Take 220 mg by mouth 2 (two) times daily with a meal.     No facility-administered medications prior to visit.    PAST MEDICAL HISTORY: Past Medical History  Diagnosis Date  . Hypertension   . Fibromyalgia   . GERD (gastroesophageal reflux disease)   . Kidney stones 1982    PAST SURGICAL HISTORY: Past Surgical History  Procedure Laterality Date  . Ovarian cyst surgery  2011  . Nose surgery  1998    reconstruction  . Carpel tunnel  2000    right    FAMILY HISTORY: Family History  Problem Relation Age of Onset  .  Healthy Mother   . Healthy Father     SOCIAL HISTORY:  Social History   Social History  . Marital Status: Single    Spouse Name: N/A  . Number of Children: 2  . Years of Education: 13   Occupational History  . retired     Product/process development scientist   Social History Main Topics  . Smoking status: Never Smoker   . Smokeless tobacco: Not on file  . Alcohol Use: No  . Drug Use: No  . Sexual Activity: Not on file   Other Topics Concern  . Not on file   Social History Narrative   Lives at home alone   Drinks 16 oz tea daily      PHYSICAL EXAM   GENERAL EXAM/CONSTITUTIONAL: Vitals:  Filed Vitals:   02/01/15 1346  BP: 120/84  Pulse: 78  Height:  (1.676 m)  Weight: 194 lb (87.998 kg)   Body mass index is 31.33 kg/(m^2). No exam data present  Patient is in no distress; well developed, nourished and groomed; neck is supple  CARDIOVASCULAR:  Examination of carotid arteries is normal; no carotid bruits  Regular rate and rhythm, no murmurs  Examination of peripheral vascular system by observation and palpation is normal   MUSCULOSKELETAL:  Gait, strength, tone, movements noted in Neurologic exam below  NEUROLOGIC: MENTAL STATUS:  No flowsheet data found.  awake, alert, oriented to person, place and time  recent and remote memory intact  normal attention and concentration  language fluent, comprehension intact, naming intact,   fund of knowledge appropriate  CRANIAL NERVE:   2nd - no papilledema on fundoscopic exam  2nd, 3rd, 4th, 6th - pupils equal and reactive to light, visual fields full to confrontation, extraocular muscles intact, no nystagmus  5th - facial sensation symmetric  7th - facial strength symmetric  8th - hearing intact  9th - palate elevates symmetrically, uvula midline  11th - shoulder shrug symmetric  12th - tongue protrusion midline  VOICE HOARSE, SOFT  MOTOR:   normal bulk; INCREASED TONE IN BUE WITH CONTRALATERAL REINFORCEMENT; MODERATE BRADYKINESIA (LEFT WORSE THAN RIGHT IN UPPER AND LOWER EXT; LOWER EXT WORSE THAN UPPER EXT; full strength in the BUE, BLE  SENSORY:   normal and symmetric to light touch, pinprick, temperature; VIBRATION 6 SEC AT TOES  COORDINATION:   finger-nose-finger, fine finger movements normal  REFLEXES:   deep tendon reflexes: BUE 3, KNEES 3, ANKLES TRACE; POSITIVE SNOUT; BORDERLINE MYERSONS  GAIT/STATION:   narrow based gait; NEEDS ARMS TO HELP STAND UP; SLOW, SHORT STEPS, UNSTEADY, EN BLOC TURNING; DECR ARM  SWING; CANNOT TANDEM, TOE OR HEEL WALK; romberg is negative    DIAGNOSTIC DATA (LABS, IMAGING, TESTING) - I reviewed patient records, labs, notes, testing and imaging myself where available.  No results found for: WBC, HGB, HCT, MCV, PLT No results found for: NA, K, CL, CO2, GLUCOSE, BUN, CREATININE, CALCIUM, PROT, ALBUMIN, AST, ALT, ALKPHOS, BILITOT, GFRNONAA, GFRAA No results found for: CHOL, HDL, LDLCALC, LDLDIRECT, TRIG, CHOLHDL No results found for: ZOXW9U No results found for: VITAMINB12 No results found for: TSH   10/19/14 MRI brain [I reviewed images myself and agree with interpretation. -VRP]  1. No acute intracranial abnormality or mass. 2. Mild chronic small vessel ischemic disease and cerebral atrophy.  11/03/14 MRI cervical spine (without) [I reviewed images myself and agree with interpretation. -VRP] 1. At C3-4: disc bulging with mild spinal stenosis with moderate right and severe left foraminal stenosis; no cord signal  changes 2. At C7-T1: pseudo-disc bulging with mild spinal stenosis, mild right and severe left foraminal stenosis; no cord signal changes 3. At C4-5, C5-6: disc bulging with moderate left foraminal stenosis     ASSESSMENT AND PLAN  69 y.o. year old female here with progressive gait and balance difficulty, with cogwheel rigidity, bradykinesia, masked facies, positive snout and Myerson reflexes. Signs and symptoms are concerning for parkinsonism. Also with mild spinal stenosis at C3-4 level, hyper-reflexia in BUE and BLE, and unsteady gait. Some improvement with gait with PT exercises.  Dx: cervical myelopathy + parkinsonism   PLAN: I spent 15 minutes of face to face time with patient. Greater than 50% of time was spent in counseling and coordination of care with patient. In summary we discussed:  - continue PT evaluation - follow up referral to neurosurgery for cervical myelopathy evaluation  Return in about 6 months (around 08/01/2015).    Suanne Marker, MD 02/01/2015, 2:21 PM Certified in Neurology, Neurophysiology and Neuroimaging  [facility] [address], [address] [place], [place] [address] [phone]

## 2015-02-01 NOTE — Patient Instructions (Signed)
Thank you for coming to see [place] at [facility]. I hope we have been able to provide you high quality care today.  You may receive a patient satisfaction survey over the next few weeks. We would appreciate your feedback and comments so that we may continue to improve ourselves and the health of our patients.  - follow up referral to neurosurgery clinic - continue physical therapy exercises   ~~~~~~~~~~~~~~~~~~~~~~~~~~~~~~~~~~~~~~~~~~~~~~~~~~~~~~~~~~~~~~~~~  DR. PENUMALLI'S GUIDE TO HAPPY AND HEALTHY LIVING These are some of my general health and wellness recommendations. Some of them may apply to you better than others. Please use common sense as you try these suggestions and feel free to ask me any questions.   ACTIVITY/FITNESS Mental, social, emotional and physical stimulation are very important for brain and body health. Try learning a new activity (arts, music, language, sports, games).  Keep moving your body to the best of your abilities. You can do this at home, inside or outside, the park, community center, gym or anywhere you like. Consider a physical therapist or personal trainer to get started. Consider the app Sworkit. Fitness trackers such as smart-watches, smart-phones or Fitbits can help as well.   NUTRITION Eat more plants: colorful vegetables, nuts, seeds and berries.  Eat less sugar, salt, preservatives and processed foods.  Avoid toxins such as cigarettes and alcohol.  Drink water when you are thirsty. Warm water with a slice of lemon is an excellent morning drink to start the day.  Consider these websites for more information The Nutrition Source (https://www.henry-hernandez.biz/) Precision Nutrition (WindowBlog.ch)   RELAXATION Consider practicing mindfulness meditation or other relaxation techniques such as deep breathing, prayer, yoga, tai chi, massage. See website mindful.org or the apps Headspace or  Calm to help get started.   SLEEP Try to get at least 7-8+ hours sleep per day. Regular exercise and reduced caffeine will help you sleep better. Practice good sleep hygeine techniques. See website sleep.org for more information.   PLANNING Prepare estate planning, living will, healthcare POA documents. Sometimes this is best planned with the help of an attorney. Theconversationproject.org and agingwithdignity.org are excellent resources.

## 2015-02-16 ENCOUNTER — Ambulatory Visit: Payer: Medicare PPO | Attending: Diagnostic Neuroimaging | Admitting: Physical Therapy

## 2015-02-16 DIAGNOSIS — R269 Unspecified abnormalities of gait and mobility: Secondary | ICD-10-CM | POA: Diagnosis present

## 2015-02-16 DIAGNOSIS — R29898 Other symptoms and signs involving the musculoskeletal system: Secondary | ICD-10-CM | POA: Diagnosis present

## 2015-02-19 ENCOUNTER — Encounter: Payer: Self-pay | Admitting: Physical Therapy

## 2015-02-19 NOTE — Therapy (Signed)
[facility] [address] [place], [place], [address] Phone: [phone]   Fax:  [phone]  Physical Therapy Treatment  Patient Details  Name: Heather Brady MRN: [id number] Date of Birth: 07/14/1945 No Data Recorded  Encounter Date: 02/16/2015      PT End of Session - 02/19/15 0901    Visit Number 5   Number of Visits 9   Date for PT Re-Evaluation 03/07/15   Authorization Type [employer]   Authorization Time Period through 03-05-15   Authorization - Visit Number 5   Authorization - Number of Visits 6      Past Medical History  Diagnosis Date  . Hypertension   . Fibromyalgia   . GERD (gastroesophageal reflux disease)   . Kidney stones 1982    Past Surgical History  Procedure Laterality Date  . Ovarian cyst surgery  2011  . Nose surgery  1998    reconstruction  . Carpel tunnel  2000    right    There were no vitals filed for this visit.  Visit Diagnosis:  Abnormality of gait  Bilateral leg weakness      Subjective Assessment - 02/19/15 0854    Subjective Pt reports doing alot of exercises - legs are sore from doing exercises yesterday   Pertinent History cervical spondylosis recently diagnosed; fibromyalgia, HTN, acid reflux   Patient Stated Goals Improve walking and balance   Currently in Pain? No/denies  soreness but no true pain                         [facility] Adult PT Treatment/Exercise - 02/19/15 0001    Transfers   Transfers Sit to Stand   Sit to Stand With upper extremity assist  on blue Airex foam 10 reps   Ambulation/Gait   Ambulation/Gait Yes   Ambulation/Gait Assistance 5: Supervision   Ambulation/Gait Assistance Details 250   Assistive device None   Ambulation Surface Level;Indoor   Stairs Yes   Stairs Assistance 5: Supervision   Stair Management Technique Two rails;Alternating pattern;Forwards   Number of Stairs 4   Height of Stairs 6   Neuro Re-ed     Neuro Re-ed Details  Pt performed single limb stance activities inside bars with UE support prn - stepping over and bakc of balance beam; rolling ball up/back and lateralyy x 10 reps each with UE support prn     TherEx: heel raises x 10 reps bil. Lower extremities Bil. Heel cord stretch on 2" step x 60 sec. Hold in standing SciFit level 4.0 x 5" with UE's and lower extremities        Balance Exercises - 02/19/15 0858    Balance Exercises: Standing   Standing Eyes Opened Narrow base of support (BOS);Head turns   Standing Eyes Closed Wide (BOA);Solid surface;1 rep   Rockerboard Anterior/posterior;Lateral;EO;30 seconds;Intermittent UE support   Other Standing Exercises cone taps with mod to min assist with LOB  alternate stepping on incline with min assist                PT Long Term Goals - 12/08/14 2114    PT LONG TERM GOAL #1   Title Improve Berg score to >/= 47/56 to reduce fall risk.  (01-07-15)   Baseline 42/56   Time 4   Period Weeks   Status New   PT LONG TERM GOAL #2   Title Incr. gait velocity to >/= 3.6 ft/sec without device  for incr. gait efficiency.  (01-07-15)   Baseline 3.28 ft/sec   Time 4   Period Weeks   Status New   PT LONG TERM GOAL #3   Title Amb. 600' without device modified independently without LOB for incr. safety with community amb.  (01-07-15)   Time 4   Period Weeks   Status New   PT LONG TERM GOAL #4   Title Independent in HEP for balance and strengthening exercises.  (01-07-15)   Time 4   Period Weeks   Status New               Plan - 02/19/15 0913    Clinical Impression Statement Pt continues to progress well towards LTG's - does continue to have decreased single limb stance   Pt will benefit from skilled therapeutic intervention in order to improve on the following deficits Abnormal gait;Decreased activity tolerance;Decreased balance;Decreased mobility;Decreased strength;Decreased coordination;Pain;Postural dysfunction   Rehab  Potential Good   PT Frequency 2x / week   PT Duration 4 weeks   PT Next Visit Plan cont balance and gait   PT Home Exercise Plan Balance HEP   Consulted and Agree with Plan of Care Patient        Problem List Patient Active Problem List   Diagnosis Date Noted  . Gait difficulty 10/20/2014  . Parkinsonism (HCC) 10/20/2014  . Dizziness and giddiness 10/20/2014    DildayDonavan Burnet, Keyler Hoge Suzanne, PT 02/19/2015, 9:18 AM  [facility] [address] [place], [place], [address] Phone: [phone]   Fax:  [phone]  Name: Heather Brady MRN: [id number] Date of Birth: [birth year]

## 2015-02-23 ENCOUNTER — Ambulatory Visit: Payer: Medicare PPO | Admitting: Physical Therapy

## 2015-03-02 ENCOUNTER — Encounter: Payer: Medicare PPO | Admitting: Physical Therapy

## 2015-04-28 ENCOUNTER — Ambulatory Visit (INDEPENDENT_AMBULATORY_CARE_PROVIDER_SITE_OTHER): Payer: Medicare PPO | Admitting: Family Medicine

## 2015-04-28 VITALS — BP 160/88 | HR 81 | Temp 98.0°F | Resp 16 | Ht 66.0 in | Wt 190.8 lb

## 2015-04-28 DIAGNOSIS — S46911A Strain of unspecified muscle, fascia and tendon at shoulder and upper arm level, right arm, initial encounter: Secondary | ICD-10-CM

## 2015-04-28 MED ORDER — PREDNISONE 20 MG PO TABS: ORAL_TABLET | ORAL | Status: DC

## 2015-04-28 NOTE — Patient Instructions (Signed)
Strain usually takes 3 weeks to heal. If you're not seeing significant progress in one week, please return  Try to get a massage for the shoulder muscles several times a week. Every 2 hours for the arm dangle down and moving circles to maintain range of motion. Use the sling as much as possible to keep the shoulder as comfortable as possible.

## 2015-04-28 NOTE — Progress Notes (Signed)
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 She presents 6 days after falling and hitting her right shoulder on the floor. She's a continued pain, particularly when trying to raise her shoulder since the episode.  Patient notes that she's had a lot of falls. She seen a neurologist who diagnosed her with Parkinson's. She has no tremor but says that she does not have the sense of balance needed to stay upright when she is moving.  Patient denies any loss of consciousness, abrasion, bony tenderness.  Patient says that when she uses her other arm to lift her right arm, she has no pain.  Objective: No acute distress BP 160/88 mmHg  Pulse 81  Temp(Src) 98 F (36.7 C) (Oral)  Resp 16  Ht 5\' 6"  (1.676 m)  Wt 190 lb 12.8 oz (86.546 kg)  BMI 30.81 kg/m2  SpO2 97% Right shoulder shows full range of motion passively, but patient cannot abduct the arm actively. She has no localized tenderness, no bony abnormality, no ecchymosis or swelling, no point tenderness.  Range of motion exercises while bent over were demonstrated.  Assessment: Contusion to right supraspinatus  This chart was scribed in my presence and reviewed by me personally.    ICD-9-CM ICD-10-CM   1. Shoulder strain, right, initial encounter 840.9 S46.911A predniSONE (DELTASONE) 20 MG tablet   Patient told to return in a week if she is not improving with gentle range of motion, prednisone, and use a sling  Signed, Elvina SidleKurt Nathen Balaban, MD

## 2015-05-06 ENCOUNTER — Ambulatory Visit (INDEPENDENT_AMBULATORY_CARE_PROVIDER_SITE_OTHER): Payer: Medicare PPO

## 2015-05-06 ENCOUNTER — Ambulatory Visit (INDEPENDENT_AMBULATORY_CARE_PROVIDER_SITE_OTHER): Payer: Medicare PPO | Admitting: Family Medicine

## 2015-05-06 VITALS — BP 138/76 | HR 88 | Temp 98.1°F | Resp 18 | Ht 66.0 in | Wt 191.2 lb

## 2015-05-06 DIAGNOSIS — M25511 Pain in right shoulder: Secondary | ICD-10-CM

## 2015-05-06 NOTE — Patient Instructions (Signed)
I will refer you to the orthopedist for further evaluation of your shoulder.  Continue sling as needed,  Range of motion is able, Tylenol or ibuprofen over-the-counter as needed, let me know if you need stronger medicine in the meantime.  Return to the clinic or go to the nearest emergency room if any of your symptoms worsen or new symptoms occur.  Shoulder Pain The shoulder is the joint that connects your arms to your body. The bones that form the shoulder joint include the upper arm bone (humerus), the shoulder blade (scapula), and the collarbone (clavicle). The top of the humerus is shaped like a ball and fits into a rather flat socket on the scapula (glenoid cavity). A combination of muscles and strong, fibrous tissues that connect muscles to bones (tendons) support your shoulder joint and hold the ball in the socket. Small, fluid-filled sacs (bursae) are located in different areas of the joint. They act as cushions between the bones and the overlying soft tissues and help reduce friction between the gliding tendons and the bone as you move your arm. Your shoulder joint allows a wide range of motion in your arm. This range of motion allows you to do things like scratch your back or throw a ball. However, this range of motion also makes your shoulder more prone to pain from overuse and injury. Causes of shoulder pain can originate from both injury and overuse and usually can be grouped in the following four categories:  Redness, swelling, and pain (inflammation) of the tendon (tendinitis) or the bursae (bursitis).  Instability, such as a dislocation of the joint.  Inflammation of the joint (arthritis).  Broken bone (fracture). HOME CARE INSTRUCTIONS   Apply ice to the sore area.  Put ice in a plastic bag.  Place a towel between your skin and the bag.  Leave the ice on for 15-20 minutes, 3-4 times per day for the first 2 days, or as directed by your health care provider.  Stop using cold  packs if they do not help with the pain.  If you have a shoulder sling or immobilizer, wear it as long as your caregiver instructs. Only remove it to shower or bathe. Move your arm as little as possible, but keep your hand moving to prevent swelling.  Squeeze a soft ball or foam pad as much as possible to help prevent swelling.  Only take over-the-counter or prescription medicines for pain, discomfort, or fever as directed by your caregiver. SEEK MEDICAL CARE IF:   Your shoulder pain increases, or new pain develops in your arm, hand, or fingers.  Your hand or fingers become cold and numb.  Your pain is not relieved with medicines. SEEK IMMEDIATE MEDICAL CARE IF:   Your arm, hand, or fingers are numb or tingling.  Your arm, hand, or fingers are significantly swollen or turn white or blue. MAKE SURE YOU:   Understand these instructions.  Will watch your condition.  Will get help right away if you are not doing well or get worse.   This information is not intended to replace advice given to you by your health care provider. Make sure you discuss any questions you have with your health care provider.   Document Released: 02/01/2005 Document Revised: 05/15/2014 Document Reviewed: 08/17/2014 Elsevier Interactive Patient Education [employer]2016 Elsevier Inc.

## 2015-05-06 NOTE — Progress Notes (Signed)
Subjective:    Patient ID: Heather Brady, female    DOB: 07-26-1945, 69 y.o.   MRN: [id number] By signing my name below, I, Javier Dockerobert Ryan Halas, attest that this documentation has been prepared under the direction and in the presence of Meredith StaggersJeffrey Corday Wyka, MD. Electronically Signed: Javier Dockerobert Ryan Halas, ER Scribe. 05/06/2015. 4:47 PM.  Chief Complaint  Patient presents with  . Follow-up    shoulder pain    HPI HPI Comments: Heather Brady is a 69 y.o. female who [facility] follow up for right shoulder pain. Her initial injury was 13 days ago. She is unable to independently lift the right arm. She has no past hx of shoulder surgeries. She is still wearing her sling, but is taking her arm out of her shoulder 12 times per day to exercise the arm. There is a loud popping sound when she lowers her arm from shoulder hight to rest at approximately 50 degrees.   She was seen five days ago by Dr. Milus GlazierLauenstein which was six days after the initial injury. She initial injury happened when she got her feet tangled up in electrical cords and fell onto her right side with her full weight. Per Dr. Andria RheinLauenstien's note that note, she has a hx of falls from parkinsons. She was having abducting the arm,. She was diagnosed with right supraspinatus contusion. Prescribed pred, ROM and a sling. X-ray was not obtained.  Past Medical History  Diagnosis Date  . Hypertension   . Fibromyalgia   . GERD (gastroesophageal reflux disease)   . Kidney stones 1982   Review of Systems     Objective:  BP 138/76 mmHg  Pulse 88  Temp(Src) 98.1 F (36.7 C) (Oral)  Resp 18  Ht 5\' 6"  (1.676 m)  Wt 191 lb 3.2 oz (86.728 kg)  BMI 30.88 kg/m2  SpO2 97%  Physical Exam  Constitutional: She is oriented to person, place, and time. She appears well-developed and well-nourished. No distress.  HENT:  Head: Normocephalic and atraumatic.  Eyes: Pupils are equal, round, and reactive to light.  Neck: Neck supple.    Cardiovascular: Normal rate.   Pulmonary/Chest: Effort normal. No respiratory distress.  Musculoskeletal: Normal range of motion.  Grip strength and ROM intact in bilateral wrists. Minimal 15-20 degrees of flexion on right. Full internal rotation. Weak with empty can testing. Negative NEER, negative hawkins. Passive abduction intact. Positive drop arm at approximately 90 degrees with pain. Positive clunk.  Neurological: She is alert and oriented to person, place, and time. Coordination normal.     Skin: Skin is warm and dry. She is not diaphoretic.  Psychiatric: She has a normal mood and affect. Her behavior is normal.  Nursing note and vitals reviewed. [facility] reading (PRIMARY) by  Dr. Neva SeatGreene: r shoulder:no apparent fx.      Assessment & Plan:  Heather Brady is a 69 y.o. female Pain in joint of right shoulder - Plan: DG Shoulder Right, Ambulatory referral to Orthopedic Surgery Suspected rtc tear vs strain, early, but positive clunk and catch on exam - DDx includes labral tear.   -refer to ortho, cont sling if needed with rom out of sling daily.   -tylenol or advil otc prn - call if stronger med needed.   -rtc precautions.   No orders of the defined types were placed in this encounter.   Patient Instructions   I will refer you to the orthopedist for further evaluation of your shoulder.  Continue  sling as needed,  Range of motion is able, Tylenol or ibuprofen over-the-counter as needed, let me know if you need stronger medicine in the meantime.  Return to the clinic or go to the nearest emergency room if any of your symptoms worsen or new symptoms occur.  Shoulder Pain The shoulder is the joint that connects your arms to your body. The bones that form the shoulder joint include the upper arm bone (humerus), the shoulder blade (scapula), and the collarbone (clavicle). The top of the humerus is shaped like a ball and fits into a rather flat socket on the scapula (glenoid cavity). A  combination of muscles and strong, fibrous tissues that connect muscles to bones (tendons) support your shoulder joint and hold the ball in the socket. Small, fluid-filled sacs (bursae) are located in different areas of the joint. They act as cushions between the bones and the overlying soft tissues and help reduce friction between the gliding tendons and the bone as you move your arm. Your shoulder joint allows a wide range of motion in your arm. This range of motion allows you to do things like scratch your back or throw a ball. However, this range of motion also makes your shoulder more prone to pain from overuse and injury. Causes of shoulder pain can originate from both injury and overuse and usually can be grouped in the following four categories:  Redness, swelling, and pain (inflammation) of the tendon (tendinitis) or the bursae (bursitis).  Instability, such as a dislocation of the joint.  Inflammation of the joint (arthritis).  Broken bone (fracture). HOME CARE INSTRUCTIONS   Apply ice to the sore area.  Put ice in a plastic bag.  Place a towel between your skin and the bag.  Leave the ice on for 15-20 minutes, 3-4 times per day for the first 2 days, or as directed by your health care provider.  Stop using cold packs if they do not help with the pain.  If you have a shoulder sling or immobilizer, wear it as long as your caregiver instructs. Only remove it to shower or bathe. Move your arm as little as possible, but keep your hand moving to prevent swelling.  Squeeze a soft ball or foam pad as much as possible to help prevent swelling.  Only take over-the-counter or prescription medicines for pain, discomfort, or fever as directed by your caregiver. SEEK MEDICAL CARE IF:   Your shoulder pain increases, or new pain develops in your arm, hand, or fingers.  Your hand or fingers become cold and numb.  Your pain is not relieved with medicines. SEEK IMMEDIATE MEDICAL CARE IF:    Your arm, hand, or fingers are numb or tingling.  Your arm, hand, or fingers are significantly swollen or turn white or blue. MAKE SURE YOU:   Understand these instructions.  Will watch your condition.  Will get help right away if you are not doing well or get worse.   This information is not intended to replace advice given to you by your health care provider. Make sure you discuss any questions you have with your health care provider.   Document Released: 02/01/2005 Document Revised: 05/15/2014 Document Reviewed: 08/17/2014 Elsevier Interactive Patient Education [employer]     I personally performed the services described in this documentation, which was scribed in my presence. The recorded information has been reviewed and considered, and addended by me as needed.

## 2015-05-11 ENCOUNTER — Telehealth: Payer: Self-pay

## 2015-05-11 DIAGNOSIS — M25519 Pain in unspecified shoulder: Secondary | ICD-10-CM

## 2015-05-11 NOTE — Telephone Encounter (Signed)
Expand All Collapse All   Pt states she was told to call if she needed stronger meds   Best phone for pt is [phone]   Pharmacy [facility] [address]

## 2015-05-12 MED ORDER — HYDROCODONE-ACETAMINOPHEN 5-325 MG PO TABS: 1.0000 | ORAL_TABLET | Freq: Four times a day (QID) | ORAL | Status: DC | PRN

## 2015-05-12 NOTE — Telephone Encounter (Signed)
Hydrocodone prescribed. Ready for pickup.  Please caution her on sedation and possible dizziness with this medication. Keep follow-up with orthopedics as planned.

## 2015-05-12 NOTE — Telephone Encounter (Signed)
Prescription reprinted (inadvertantly signed by wrong provider)

## 2015-05-13 NOTE — Telephone Encounter (Signed)
Pt.notified

## 2015-05-14 NOTE — Telephone Encounter (Signed)
I spoke to pt who didn't realize that you had Rxd the exact same medication. She thought you had Rxd something stronger than the 5-325. Pt reported that she is prescribed this for her fibromyalgia and takes Q12 hrs. I explained that she should have enough from her current Rx to take temporarily as you Rxd, Q6hrs prn, and advised to call when she is running out, since she will run out early. Pharm wants to know whether to destroy the Rx Dr Neva SeatGreene wrote, of if they should put in on file so pt can fill when it would be due?

## 2015-05-14 NOTE — Telephone Encounter (Signed)
Dr Neva SeatGreene, please see pt's report below, and I meant to include that the Dr Dereck Ligasxing the hydrocodone for fibromyalgia is Dr Ardis RowanJerone Nymberg, [phone].

## 2015-05-14 NOTE — Telephone Encounter (Signed)
I was not aware of another prescription for hydrocodone. Please call pt and check into this.  Who is she receiving hydrocodone from and how is she taking it? She should have enough left from other Rx, so agree in not filling it at this time.

## 2015-05-14 NOTE — Telephone Encounter (Signed)
Britta MccreedyBarbara  This message was in [id number] pool.   Sorry.

## 2015-05-14 NOTE — Telephone Encounter (Addendum)
Dr Neva SeatGreene, I received call from pharmacist requesting review of Rx written for hydrocodone. They stated that the pt has an ongoing Rx for hydrocodone written by another provider who Rxs it BID. She last got a RF of #60 on 04/28/15 which should last her until 05/28/15. Pharm wanted to know if Dr Neva SeatGreene was aware that she has this Rx and the one he wrote is meant to supplement this, or was he not aware? Please advise if they should fill his Rx. I called pharm back and advised that I don't see any notes suggesting that Dr Neva SeatGreene was aware so hold Rx until I call back.

## 2015-05-16 NOTE — Telephone Encounter (Signed)
The pharmacy can keep it on file, if she runs out early of her current Rx. She should advise her treating physician for fibromyalgia of this plan as he is also prescribing a narcotic.

## 2015-05-19 NOTE — Telephone Encounter (Signed)
I spoke to pt who stated that she should not need to fill the Rx from Dr Neva SeatGreene, because she can get it when needed from her reg MD. She stated that her shoulder is still hurting badly and she can not move it to use it very much. Discussed referral to ortho, and pt has not heard from them yet. Referral notes show that it was sent 12/30. Referrals can you please check on this referral? I also advised pt that she can call them to check on appt.

## 2015-05-24 NOTE — Telephone Encounter (Signed)
The patient has an appointment on 06/11/15 at 3:30pm with Dr R[facility]Supple [facility].

## 2015-06-17 ENCOUNTER — Telehealth: Payer: Self-pay

## 2015-06-17 NOTE — Telephone Encounter (Signed)
Per message created by Burnard Leigh on 05/10/2015 @ 1346 (placed on incorrect chart):  "Pt states she was told to call if she needed stronger meds  Best phone for pt is [phone]  Pharmacy [facility] [address]

## 2015-08-03 ENCOUNTER — Telehealth: Payer: Self-pay | Admitting: *Deleted

## 2015-08-03 ENCOUNTER — Encounter: Payer: Self-pay | Admitting: Diagnostic Neuroimaging

## 2015-08-03 ENCOUNTER — Ambulatory Visit (INDEPENDENT_AMBULATORY_CARE_PROVIDER_SITE_OTHER): Payer: Medicare Other | Admitting: Diagnostic Neuroimaging

## 2015-08-03 VITALS — BP 141/84 | HR 86 | Ht 66.0 in | Wt 190.4 lb

## 2015-08-03 DIAGNOSIS — R269 Unspecified abnormalities of gait and mobility: Secondary | ICD-10-CM

## 2015-08-03 DIAGNOSIS — G20 Parkinson's disease: Secondary | ICD-10-CM

## 2015-08-03 DIAGNOSIS — M4712 Other spondylosis with myelopathy, cervical region: Secondary | ICD-10-CM | POA: Diagnosis not present

## 2015-08-03 NOTE — Progress Notes (Signed)
[facility]  PATIENT: Heather GrandchildSharon K Brady DOB: 11-Dec-1945  REFERRING CLINICIAN: Nymberg  HISTORY FROM: patient  REASON FOR VISIT: follow up   HISTORICAL  CHIEF COMPLAINT:  Chief Complaint  Patient presents with  . Gait Problem    rm 7  . Follow-up    6 month    HISTORY OF PRESENT ILLNESS:   UPDATE 08/03/15: Since last visit, fell again. Now needs right rotator cuff surgery in April 2017.   UPDATE 02/01/15: Since last visit, doing better with PT. No falls. Legs stronger. Never heard from neurosurg clinic. Overall stable to slightly improved. Tried carb/levo, but could not tolerate (nausea and itching).   UPDATE 11/25/14: Since last visit, doing about the same. MRI c-spine reviewed. Not heard from PT yet. No more falls. Stays at home mainly.  PRIOR HPI (10/20/14): 70 year old right-handed female here for evaluation of gait and balance difficulty. For past 1 year patient has had increasing problems with balance or walking, controlling her feet, resulting in at least 4 falls. Most recent fall was several weeks ago, where she fell forward without warning. She wanted to walk forward. Her feet were stuck in left behind. She struck her left forehead and has left periorbital swelling and ecchymoses. Patient also has difficulty getting up from a chair. She has been more cautious and has stopped walking as far she used to. In 2012 she was walking 5 miles per day, as well as doing country dancing, and very active. 2014 she decreased her walking to 3 miles per day. By 2015 she had stopped walking for daily exercise. She is very fearful of falling and barely walks at all. Patient also has intermittent numbness and tingling in hands or feet for past 10 years. 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   REVIEW OF SYSTEMS: Full 14 system review of systems performed and notable only for excessive thirst constipation walking diff insomnia.      ALLERGIES: Allergies  Allergen Reactions  . Amoxicillin Itching  . Carbidopa-Levodopa Itching    HOME MEDICATIONS: Outpatient Prescriptions Prior to Visit  Medication Sig Dispense Refill  . amLODipine (NORVASC) 10 MG tablet Take 10 mg by mouth daily.    Marland Kitchen. esomeprazole (NEXIUM) 40 MG capsule Take 40 mg by mouth daily at 12 noon.    Marland Kitchen. HYDROcodone-acetaminophen (NORCO/VICODIN) 5-325 MG tablet Take 1 tablet by mouth every 6 (six) hours as needed for moderate pain. 15 tablet 0  . levothyroxine (SYNTHROID, LEVOTHROID) 75 MCG tablet     . losartan (COZAAR) 50 MG tablet     . naproxen sodium (ANAPROX) 220 MG tablet Take 220 mg by mouth as needed.    . Vitamin D, Ergocalciferol, (DRISDOL) 50000 UNITS CAPS capsule TAKE ONE CAPSULE BY MOUTH EVERY 2 WEEKS  5  . ibuprofen (ADVIL,MOTRIN) 100 MG chewable tablet Chew by mouth every 8 (eight) hours as needed.     No facility-administered medications prior to visit.    PAST MEDICAL HISTORY: Past Medical History  Diagnosis Date  . Hypertension   . Fibromyalgia   . GERD (gastroesophageal reflux disease)   . Kidney stones 1982    PAST SURGICAL HISTORY: Past Surgical History  Procedure Laterality Date  . Ovarian cyst surgery  2011  . Nose surgery  1998    reconstruction  . Carpel tunnel  2000    right    FAMILY HISTORY: Family History  Problem Relation Age of Onset  . Healthy Mother   .  Healthy Father     SOCIAL HISTORY:  Social History   Social History  . Marital Status: Single    Spouse Name: N/A  . Number of Children: 2  . Years of Education: 13   Occupational History  . retired     Product/process development scientist   Social History Main Topics  . Smoking status: Never Smoker   . Smokeless tobacco: Not on file  . Alcohol Use: No  . Drug Use: No  . Sexual Activity: Not on file   Other Topics Concern  . Not on file   Social History Narrative   Lives at home alone   Drinks 16 oz tea daily     PHYSICAL  EXAM   GENERAL EXAM/CONSTITUTIONAL: Vitals:  Filed Vitals:   08/03/15 1257  BP: 141/84  Pulse: 86  Height:  (1.676 m)  Weight: 190 lb 6.4 oz (86.365 kg)   Body mass index is 30.75 kg/(m^2). No exam data present  Patient is in no distress; well developed, nourished and groomed; neck is supple  CARDIOVASCULAR:  Examination of carotid arteries is normal; no carotid bruits  Regular rate and rhythm, no murmurs  Examination of peripheral vascular system by observation and palpation is normal   MUSCULOSKELETAL:  Gait, strength, tone, movements noted in Neurologic exam below  NEUROLOGIC: MENTAL STATUS:  No flowsheet data found.  awake, alert, oriented to person, place and time  recent and remote memory intact  normal attention and concentration  language fluent, comprehension intact, naming intact,   fund of knowledge appropriate  CRANIAL NERVE:   2nd - no papilledema on fundoscopic exam  2nd, 3rd, 4th, 6th - pupils equal and reactive to light, visual fields full to confrontation, extraocular muscles intact, no nystagmus  5th - facial sensation symmetric  7th - facial strength symmetric  8th - hearing intact  9th - palate elevates symmetrically, uvula midline  11th - shoulder shrug symmetric  12th - tongue protrusion midline  VOICE HOARSE, SOFT  MASKED FACIES  MOTOR:   normal bulk; INCREASED TONE IN BUE WITH CONTRALATERAL REINFORCEMENT; MODERATE BRADYKINESIA (LEFT WORSE THAN RIGHT IN UPPER AND LOWER EXT; LOWER EXT WORSE THAN UPPER EXT; full strength in the BUE, BLE; EXCEPT RIGHT DELTOID LIMITED BY PAIN  SENSORY:   normal and symmetric to light touch, pinprick, temperature; DECR VIB AT TOES  COORDINATION:   finger-nose-finger, fine finger movements normal  REFLEXES:   deep tendon reflexes: BUE 3, KNEES 3, ANKLES TRACE; POSITIVE SNOUT; BORDERLINE MYERSONS  GAIT/STATION:   narrow based gait; NEEDS ARMS TO HELP STAND UP; SLOW, SHORT STEPS,  UNSTEADY, EN BLOC TURNING; DECR ARM SWING; CANNOT TANDEM, TOE OR HEEL WALK; romberg is negative    DIAGNOSTIC DATA (LABS, IMAGING, TESTING) - I reviewed patient records, labs, notes, testing and imaging myself where available.  No results found for: WBC, HGB, HCT, MCV, PLT No results found for: NA, K, CL, CO2, GLUCOSE, BUN, CREATININE, CALCIUM, PROT, ALBUMIN, AST, ALT, ALKPHOS, BILITOT, GFRNONAA, GFRAA No results found for: CHOL, HDL, LDLCALC, LDLDIRECT, TRIG, CHOLHDL No results found for: ZOXW9U No results found for: VITAMINB12 No results found for: TSH   10/19/14 MRI brain [I reviewed images myself and agree with interpretation. -VRP]  1. No acute intracranial abnormality or mass. 2. Mild chronic small vessel ischemic disease and cerebral atrophy.  11/03/14 MRI cervical spine (without) [I reviewed images myself and agree with interpretation. -VRP] 1. At C3-4: disc bulging with mild spinal stenosis with moderate right and severe left  foraminal stenosis; no cord signal changes 2. At C7-T1: pseudo-disc bulging with mild spinal stenosis, mild right and severe left foraminal stenosis; no cord signal changes 3. At C4-5, C5-6: disc bulging with moderate left foraminal stenosis     ASSESSMENT AND PLAN  70 y.o. year old female here with progressive gait and balance difficulty, with cogwheel rigidity, bradykinesia, masked facies, positive snout and Myerson reflexes. Signs and symptoms are concerning for parkinsonism. However, intolerant of carbidopa/levodopa. Also with mild spinal stenosis at C3-4 level, hyper-reflexia in BUE and BLE, and unsteady gait. Symptoms continuing to progress.  Dx: cervical myelopathy +/- parkinsonism (akinetic-rigid)   PLAN: - refer to neurosurgery for cervical myelopathy evaluation - use cane/walker for fall prevention  Orders Placed This Encounter  Procedures  . Ambulatory referral to Neurosurgery    Referral Priority:  Routine    Referral Type:  Surgical     Referral Reason:  Specialty Services Required    Requested Specialty:  Neurosurgery    Number of Visits Requested:  1   Return in about 6 months (around 02/03/2016).    Suanne Marker, MD 08/03/2015, 1:14 PM Certified in Neurology, Neurophysiology and Neuroimaging  [facility] [address], [address] [place], [place] [address] [phone]

## 2015-08-03 NOTE — Telephone Encounter (Signed)
LVM requesting call back re: fu on referral sent 11/2014 for neurosurgery consult. Left this caller's name, number.

## 2015-08-03 NOTE — Patient Instructions (Signed)
-   I will setup neuro-surgery evaluation - use cane/walker for fall prevention

## 2015-08-04 NOTE — Telephone Encounter (Signed)
Spoke with Marylene LandAngela, [facility] re: referral 11/2014. She stated she will check into why patient was not seen and call back.

## 2016-02-08 ENCOUNTER — Ambulatory Visit: Payer: Medicare Other | Admitting: Diagnostic Neuroimaging

## 2016-02-10 ENCOUNTER — Encounter: Payer: Self-pay | Admitting: Diagnostic Neuroimaging

## 2016-06-13 ENCOUNTER — Ambulatory Visit: Payer: Medicare PPO | Admitting: Neurology

## 2016-07-10 ENCOUNTER — Ambulatory Visit: Payer: Medicare PPO | Admitting: Neurology

## 2016-07-19 NOTE — Progress Notes (Signed)
Heather Brady was seen today in the movement disorders clinic for neurologic consultation at the request of Dr. Venetia MaxonStern.    Pt is accompanied by her daughter who supplements the history.  The consultation is for the evaluation of dizziness and RLS.  She previously saw Dr. Marjory LiesPenumalli and I have taken an extensive amount of time to review his records.  The patient began to see Dr. Marjory LiesPenumalli in June, 2016.  At that point, gait change, falls and balance issues have been going on for about 1 year.  She was started on a trial of levodopa in July, 2016.  She called back not long thereafter complaining about side effects, although records are unclear about what the side effects were.  Pt states that she had a rash and she d/c the med and restarted it and had a rash again.   She therefore stopped the medication.  She states that she doesn't know if the medication worked.  States that she was referred to Dr. Venetia MaxonStern and he didn't recommend neck surgery.  She was told to come here for 2nd opinion.    Specific Symptoms:  Tremor: No. Family hx of similar:  No. Voice: no change Sleep: sleeps well per pt (not per daughter - after daughter probes her patient admits trouble staying asleep)  Vivid Dreams:  No.  Acting out dreams:  No. Wet Pillows: No. Postural symptoms:  Yes.    Falls?  Yes.   (last fall was few months ago getting into daughters truck when door hit her and she fell on butt; fell in 02/2015 and she hit side of curb and fell and hit head and had 6 stitches) Bradykinesia symptoms: slow movements and difficulty getting out of a chair Loss of smell:  No. Loss of taste:  No. Urinary Incontinence:  Yes.   (doesn't wear undergarments - urinary urgency) Difficulty Swallowing:  No. Handwriting, micrographia: No. (some difficulty writing due to shoulder surgery) Trouble with ADL's:  No.  Trouble buttoning clothing: No. Depression:  No. Memory changes:  Yes.   (short term loss) Hallucinations:   No.  visual distortions: No. N/V:  No. Lightheaded:  No.  Syncope: No. Diplopia:  No. Dyskinesia:  No.  Neuroimaging has previously been performed.  It available for my review today.  She had an MRI of the cervical spine in June, 2016.  I reviewed this.  There was some disc bulging at the C5-C6 level, but no spinal cord signal change.  MRI of the brain was also done in June, 2016.  I reviewed this as well.  There was mild to moderate small vessel disease.  PREVIOUS MEDICATIONS: Sinemet  ALLERGIES:   Allergies  Allergen Reactions  . Amoxicillin Itching  . Carbidopa-Levodopa Itching    CURRENT MEDICATIONS:  Outpatient Encounter Prescriptions as of 07/21/2016  Medication Sig  . amLODipine (NORVASC) 10 MG tablet Take 10 mg by mouth daily.  Marland Kitchen. esomeprazole (NEXIUM) 40 MG capsule Take 40 mg by mouth daily at 12 noon.  Marland Kitchen. HYDROcodone-acetaminophen (NORCO/VICODIN) 5-325 MG tablet Take 1 tablet by mouth every 6 (six) hours as needed for moderate pain.  Marland Kitchen. ibuprofen (ADVIL,MOTRIN) 800 MG tablet Take 800 mg by mouth 3 (three) times daily as needed.  Marland Kitchen. levothyroxine (SYNTHROID, LEVOTHROID) 75 MCG tablet   . losartan (COZAAR) 50 MG tablet   . naproxen sodium (ANAPROX) 220 MG tablet Take 220 mg by mouth as needed.  . Vitamin D, Ergocalciferol, (DRISDOL) 50000 UNITS CAPS capsule TAKE ONE CAPSULE  BY MOUTH EVERY 2 WEEKS  . [DISCONTINUED] diazepam (VALIUM) 5 MG tablet Take 5 mg by mouth at bedtime as needed. Reported on 08/03/2015   No facility-administered encounter medications on file as of 07/21/2016.     PAST MEDICAL HISTORY:   Past Medical History:  Diagnosis Date  . Fibromyalgia   . GERD (gastroesophageal reflux disease)   . Hypertension   . Kidney stones 1982    PAST SURGICAL HISTORY:   Past Surgical History:  Procedure Laterality Date  . carpel tunnel  2000   right  . NOSE SURGERY  1998   reconstruction  . OVARIAN CYST SURGERY  2011  . ROTATOR CUFF REPAIR Right     SOCIAL  HISTORY:   Social History   Social History  . Marital status: Single    Spouse name: N/A  . Number of children: 2  . Years of education: 13   Occupational History  . retired     Product/process development scientist   Social History Main Topics  . Smoking status: Never Smoker  . Smokeless tobacco: Never Used  . Alcohol use No  . Drug use: No  . Sexual activity: Not on file   Other Topics Concern  . Not on file   Social History Narrative   Lives at home alone   Drinks 16 oz tea daily    FAMILY HISTORY:   Family Status  Relation Status  . Mother Deceased at age 39  . Father Deceased at age 30  . Sister Alive  . Brother Alive  . Child Alive    ROS:  R leg drags compared to the left.  Denies feet paresthesias.  A complete 10 system review of systems was obtained and was unremarkable apart from what is mentioned above.  PHYSICAL EXAMINATION:    VITALS:   Vitals:   07/21/16 1324  Weight: 184 lb (83.5 kg)  Height: 5\' 9"  (1.753 m)    GEN:  The patient appears stated age and is in NAD. HEENT:  Normocephalic, atraumatic.  The mucous membranes are moist. The superficial temporal arteries are without ropiness or tenderness.No tongue fasciculations are noted. CV:  RRR Lungs:  CTAB Neck/HEME:  There are no carotid bruits bilaterally.  Neurological examination:  Orientation: The patient is alert and oriented x3. Fund of knowledge is appropriate.  Recent and remote memory are intact.  Attention and concentration are normal.    Able to name objects and repeat phrases. Cranial nerves: There is good facial symmetry. Pupils are equal round and reactive to light bilaterally. Fundoscopic exam reveals clear margins bilaterally. Extraocular muscles are intact. The visual fields are full to confrontational testing. The speech is fluent and clear but she has trouble with the gutteral sounds.  There are no square wave jerks.   Soft palate rises symmetrically and there is no tongue deviation. Hearing is  intact to conversational tone. Sensation: Sensation is intact to light and pinprick throughout (facial, trunk, extremities). Vibration is intact at the bilateral big toe. There is no extinction with double simultaneous stimulation. There is no sensory dermatomal level identified. Motor: Strength is 5/5 in the bilateral upper and lower extremities.   Shoulder shrug is equal and symmetric.  There is no pronator drift.  There are no fasciculations. Deep tendon reflexes: Deep tendon reflexes are 3/4 at the bilateral biceps, triceps, brachioradialis, patella and achilles. Plantar responses are downgoing bilaterally.  Movement examination: Tone: There is increased tone throughout, but more in the right upper and lower extremity  compared to the left Abnormal movements: none seen.  Slight tremulousness can be felt Coordination:  There is slow RAM's, with any form of RAMS, including alternating supination and pronation of the forearm, hand opening and closing, finger taps, heel taps and toe taps, but not necessarily decremation rapid alternating movements. Gait and Station: The patient has difficulty arising out of a deep-seated chair without the use of the hands and is unable to do this, but she is able to push off the chair and get up.  Once up, she is slow to ambulate.  Her gait is wide-based and somewhat ataxic.  She is unsteady.  Labs:  The patient had lab work on 04/27/2016.  Sodium was 139, potassium 3.7, chloride 105, CO2 27, BUN 22, creatinine 1.02, AST 20, ALT 16, alkaline phosphatase 78, no M spike in the serum protein electrophoresis  ASSESSMENT/PLAN:  1.  Hyperreflexia, falls, gait instability, with mild pseudobulbar speech  -top on the DDx is PLS vs possible PSP, although my suspicion for PSP is much lower.  Has already had unremarkable neuroimaging of the cervical spine and brain.  Reports allergy to levodopa.  Told her that I don't think that she is actually allergic to levodopa, although it  is certainly conceivable that she could be allergic to one of the fillers in the pill.  -needs to use ambulatory assistive device at all times  -We will do an EMG.  -may need more labs depending on results of above  -DaT scan will be done  2.  Dizziness (only mentioned as this was mentioned as reason for referral)  -pt denies this  -not orthostatic in the office today  3.  We will follow-up after the above has been completed.  Greater than 50% of the visit was spent in counseling and coordinating care.  Total visit time was 60 minutes.

## 2016-07-21 ENCOUNTER — Ambulatory Visit (INDEPENDENT_AMBULATORY_CARE_PROVIDER_SITE_OTHER): Payer: Medicare Other | Admitting: Neurology

## 2016-07-21 ENCOUNTER — Encounter: Payer: Self-pay | Admitting: Neurology

## 2016-07-21 VITALS — Ht 69.0 in | Wt 184.0 lb

## 2016-07-21 DIAGNOSIS — R258 Other abnormal involuntary movements: Secondary | ICD-10-CM

## 2016-07-21 DIAGNOSIS — R292 Abnormal reflex: Secondary | ICD-10-CM

## 2016-07-21 DIAGNOSIS — R251 Tremor, unspecified: Secondary | ICD-10-CM | POA: Diagnosis not present

## 2016-07-21 DIAGNOSIS — R269 Unspecified abnormalities of gait and mobility: Secondary | ICD-10-CM

## 2016-07-21 NOTE — Patient Instructions (Signed)
1. We will call you once DAT scan is available at [facility].   2. We will schedule EMG.

## 2016-08-01 ENCOUNTER — Ambulatory Visit (INDEPENDENT_AMBULATORY_CARE_PROVIDER_SITE_OTHER): Payer: Medicare Other | Admitting: Neurology

## 2016-08-01 DIAGNOSIS — R292 Abnormal reflex: Secondary | ICD-10-CM

## 2016-08-01 DIAGNOSIS — R269 Unspecified abnormalities of gait and mobility: Secondary | ICD-10-CM

## 2016-08-01 DIAGNOSIS — R258 Other abnormal involuntary movements: Secondary | ICD-10-CM

## 2016-08-01 DIAGNOSIS — G5621 Lesion of ulnar nerve, right upper limb: Secondary | ICD-10-CM

## 2016-08-01 NOTE — Procedures (Signed)
[facility]  [address], [address]  [place], [place] [address] Tel: [phone] Fax:  [phone] Test Date:  08/01/2016  Patient: Heather RedderSharon Rosamond DOB: August 30, 1945 Physician: Nita Sickleonika Damain Broadus, DO  Sex: Female Height: 5\' 9"  Ref Phys: Kerin Salenebecca Tat, D.O.  ID#: [id number] Temp: 35.7C Technician:    Patient Complaints: This is a 71 year-old female referred for evaluation of generalized weakness and gait difficulty.  NCV & EMG Findings: Extensive electrodiagnostic testing of the right upper and lower extremity midthoracic paraspinal muscles (T7 and T11 levels), and bulbar muscles shows:  1. Right median and ulnar sensory responses are within normal limits. Right median motor responses within normal limits. Right ulnar motor response shows conduction velocity slowing across the elbow with normal latency and amplitude.  2. Right sural sensory response shows reduced amplitude and superficial peroneal sensory response is borderline low. Right peroneal motor response at the extensor digitorum brevis is absent, and normal at the tibialis anterior. Right tibial motor response also shows reduced amplitude. Of note, there is significant ankle edema which may be hampering the results of the study. 3. There is no evidence of active or chronic motor axon loss changes affecting any of the tested muscles involving the bulbar, cervical, thoracic, or lumbosacral regions. 4. There were no fasciculation potentials seen in any of the tested muscles.  Impression: 1. 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 Of note, these findings may be hampered by technical factors due to lower extremity edema, clinical correlation is recommended.  2. Right ulnar neuropathy with slowing across the elbow, purely demyelinating in type. 3. There is no evidence of a widespread disorder of anterior horn cell or cervical/lumbosacral  radiculopathy.   ___________________________ Nita Sickleonika Artin Mceuen, DO    Nerve Conduction Studies Anti Sensory Summary Table   Site NR Peak (ms) Norm Peak (ms) P-T Amp (V) Norm P-T Amp  Right Median Anti Sensory (2nd Digit)  Wrist    3.5 <3.8 16.1 >10  Right Sup Peroneal Anti Sensory (Ant Lat Mall)  12 cm    2.3 <4.6 3.1 >3  Right Sural Anti Sensory (Lat Mall)    Lower extremity edema  Calf    3.2 <4.6 1.4 >3  Right Ulnar Anti Sensory (5th Digit)  Wrist    3.2 <3.2 14.1 >5   Motor Summary Table   Site NR Onset (ms) Norm Onset (ms) O-P Amp (mV) Norm O-P Amp Site1 Site2 Delta-0 (ms) Dist (cm) Vel (m/s) Norm Vel (m/s)  Right Median Motor (Abd Poll Brev)  Wrist    4.0 <4.0 7.0 >5 Elbow Wrist 5.1 26.0 51 >50  Elbow    9.1  6.8         Right Peroneal Motor (Ext Dig Brev)  Ankle NR  <6.0  >2.5 B Fib Ankle  0.0  >40  B Fib NR     Poplt B Fib  0.0  >40  Poplt NR            Right Peroneal TA Motor (Tib Ant)  Fib Head    3.8 <4.5 3.1 >3 Poplit Fib Head 1.0 8.0 80 >40  Poplit    4.8  3.0         Right Tibial Motor (Abd Hall Brev)    Lower extremity edema  Ankle    5.5 <6.0 3.0 >4 Knee Ankle 11.6 37.0 32 >40  Knee    17.1  1.8  Right Ulnar Motor (Abd Dig Minimi)  Wrist    2.7 <3.1 8.8 >7 B Elbow Wrist 3.2 24.0 75 >50  B Elbow    5.9  7.7  A Elbow B Elbow 2.5 10.0 40 >50  A Elbow    8.4  7.3          H Reflex Studies   NR H-Lat (ms) Lat Norm (ms) L-R H-Lat (ms)  Right Tibial (Gastroc)     37.82 <35    EMG   Side Muscle Ins Act Fibs Psw Fasc Number Recrt Dur Dur. Amp Amp. Poly Poly. Comment  Right 1stDorInt Nml Nml Nml Nml Nml Nml Nml Nml Nml Nml Nml Nml N/A  Right Ext Indicis Nml Nml Nml Nml Nml Nml Nml Nml Nml Nml Nml Nml N/A  Right Deltoid Nml Nml Nml Nml Nml Nml Nml Nml Nml Nml Nml Nml N/A  Right PronatorTeres Nml Nml Nml Nml Nml Nml Nml Nml Nml Nml Nml Nml N/A  Right Biceps Nml Nml Nml Nml Nml Nml Nml Nml Nml Nml Nml Nml N/A  Right Triceps Nml Nml Nml Nml Nml Nml Nml Nml  Nml Nml Nml Nml N/A  Right Gastroc Nml Nml Nml Nml Nml Nml Nml Nml Nml Nml Nml Nml N/A  Right AntTibialis Nml Nml Nml Nml Nml Nml Nml Nml Nml Nml Nml Nml N/A  Right Flex Dig Long Nml Nml Nml Nml Nml Nml Nml Nml Nml Nml Nml Nml N/A  Right RectFemoris Nml Nml Nml Nml Nml Nml Nml Nml Nml Nml Nml Nml N/A  Right BicepsFemS Nml Nml Nml Nml Nml Nml Nml Nml Nml Nml Nml Nml N/A  Right GluteusMed Nml Nml Nml Nml Nml Nml Nml Nml Nml Nml Nml Nml N/A  Right Lumbo Parasp Low Nml Nml Nml Nml Nml Nml Nml Nml Nml Nml Nml Nml N/A  Right T7 Parasp Nml Nml Nml Nml Nml Nml Nml Nml Nml Nml Nml Nml N/A  Right T11 Parasp Nml Nml Nml Nml Nml Nml Nml Nml Nml Nml Nml Nml N/A  Right Mentalis Nml Nml Nml Nml Nml Nml Nml Nml Nml Nml Nml Nml N/A  Right Hyoglossus Nml Nml Nml Nml Nml Nml Nml Nml Nml Nml Nml Nml N/A      Waveforms:

## 2016-08-02 ENCOUNTER — Telehealth: Payer: Self-pay | Admitting: Neurology

## 2016-08-02 NOTE — Telephone Encounter (Signed)
-----   Message from Octaviano Battyebecca S Tat, DO sent at 08/02/2016 10:02 AM EDT ----- Tell pt that while EMG showed some neuropathy, it didn't show anything that explains symptoms.  Will await DaT scanning

## 2016-08-02 NOTE — Telephone Encounter (Signed)
Patient returned call. Thank you 

## 2016-08-02 NOTE — Telephone Encounter (Signed)
Called patient back and made her aware. Will call when we can schedule DAT scan.

## 2016-08-02 NOTE — Telephone Encounter (Signed)
Left message on machine for patient to call back.

## 2016-08-16 ENCOUNTER — Telehealth: Payer: Self-pay | Admitting: Neurology

## 2016-08-16 DIAGNOSIS — M7989 Other specified soft tissue disorders: Secondary | ICD-10-CM

## 2016-08-16 NOTE — Telephone Encounter (Signed)
Caller: Patient Heather Brady    Urgent? Yes  Reason for the call: She said she had an EMG the end of March and she said she has fallen twice and she is bruised on one side. She also said she cannot feel her leg. Please call. Thanks

## 2016-08-16 NOTE — Telephone Encounter (Signed)
Tried to call patient back with no answer. Left message on machine.

## 2016-08-16 NOTE — Telephone Encounter (Signed)
Spoke with patient.  She has never had numbness in her leg.  She states since four days after her EMG she has developed pain in the right leg that is constant. At times it feels so heavy she can't lift it.  She is having more falls. She has fallen twice this week. She has not hit her head and has had no LOC.  I offered to send her to [facility] for her DAT scan since we do not yet have a date for [facility]. She still wants to wait and will call me if she changes her mind.

## 2016-08-16 NOTE — Telephone Encounter (Signed)
You can order doppler, although suspicion is low.  And reiterate that happy to order DaT at [facility] as will likely be faster.

## 2016-08-16 NOTE — Telephone Encounter (Signed)
Is it red or swollen?  EMG should not affect the muscle after the testing is completed.

## 2016-08-16 NOTE — Telephone Encounter (Signed)
Spoke with patient. She did have swelling in both legs at her visit. It went down and she does have minor swelling occasionally in both legs, but swelling in right leg is worse and develops after walking. Please advise.

## 2016-08-17 NOTE — Telephone Encounter (Signed)
Patient made aware and is agreeable.   We have you scheduled for your leg Doppler on 08/21/16 at 10:00 am. Please arrive 15 minutes prior and go to [facility] Admitting If this is not a good date/time please call [phone] to reschedule.  Patient made aware of appt. We will call with results.

## 2016-08-21 ENCOUNTER — Ambulatory Visit (HOSPITAL_COMMUNITY)
Admission: RE | Admit: 2016-08-21 | Discharge: 2016-08-21 | Disposition: A | Discharge status: Home / Self Care | Payer: Medicare Other | Source: Ambulatory Visit | Attending: Neurology | Admitting: Neurology

## 2016-08-21 DIAGNOSIS — M7989 Other specified soft tissue disorders: Secondary | ICD-10-CM | POA: Insufficient documentation

## 2016-08-21 NOTE — Progress Notes (Signed)
VASCULAR LAB PRELIMINARY  PRELIMINARY  PRELIMINARY  PRELIMINARY  Right lower extremity venous duplex completed.    Preliminary report:  Right:  No evidence of DVT, superficial thrombosis, or Baker's cyst.  Heather Brady, RVS 08/21/2016, 10:52 AM

## 2016-08-22 ENCOUNTER — Telehealth: Payer: Self-pay | Admitting: Neurology

## 2016-08-22 NOTE — Telephone Encounter (Signed)
-----   Message from Octaviano Batty Tat, DO sent at 08/22/2016  2:36 PM EDT ----- Let pt know that LE u/s on the right looked good and no evidence of DVT

## 2016-08-22 NOTE — Telephone Encounter (Signed)
Mychart message sent to patient.

## 2016-08-31 ENCOUNTER — Telehealth: Payer: Self-pay | Admitting: Neurology

## 2016-08-31 NOTE — Telephone Encounter (Signed)
Caller: Jasmine December  Urgent? No  Reason for the call: She said she would also go to [facility] as well for the DAT Scan.  Thanks

## 2016-09-08 ENCOUNTER — Telehealth: Payer: Self-pay | Admitting: Neurology

## 2016-09-08 DIAGNOSIS — R251 Tremor, unspecified: Secondary | ICD-10-CM

## 2016-09-08 NOTE — Telephone Encounter (Signed)
Caller: Francene Castleina Boyle    Urgent? Yes  Reason for the call: She called because her mother is falling allot and she is very concerned. She was wanting to check and see about her mom getting in for testing as soon as she can. She said they would even drive to [facility]. Please call. Thanks

## 2016-09-08 NOTE — Telephone Encounter (Signed)
Spoke with daughter. Referral faxed to [facility] for DAT scan. They will call them directly to schedule.

## 2016-09-13 ENCOUNTER — Encounter: Payer: Self-pay | Admitting: Neurology

## 2016-09-15 ENCOUNTER — Telehealth: Payer: Self-pay | Admitting: Neurology

## 2016-09-15 NOTE — Telephone Encounter (Signed)
She heard from [facility] and is scheduled 10/12/2016.

## 2016-09-15 NOTE — Telephone Encounter (Signed)
Caller: Tina  Urgent? No  Reason for the call: PT's daughter Inetta Fermoina said she has not heard anything on a test that was to be done at [facility], did not say what the test was

## 2016-09-19 ENCOUNTER — Telehealth: Payer: Self-pay | Admitting: Neurology

## 2016-09-19 ENCOUNTER — Other Ambulatory Visit: Payer: Self-pay | Admitting: Family Medicine

## 2016-09-19 DIAGNOSIS — M8580 Other specified disorders of bone density and structure, unspecified site: Secondary | ICD-10-CM

## 2016-09-19 DIAGNOSIS — R251 Tremor, unspecified: Secondary | ICD-10-CM

## 2016-09-19 NOTE — Telephone Encounter (Signed)
Caller: PT's daughter  Urgent? No  Reason for the call: Called in regards to the test she is supposed to have and waiting on equipment

## 2016-09-19 NOTE — Telephone Encounter (Signed)
DAT Scan order entered. Message sent to April Pait to schedule.   

## 2016-09-27 ENCOUNTER — Other Ambulatory Visit: Payer: Medicare Other

## 2016-09-28 ENCOUNTER — Ambulatory Visit
Admission: RE | Admit: 2016-09-28 | Discharge: 2016-09-28 | Disposition: A | Discharge status: Home / Self Care | Payer: Medicare Other | Source: Ambulatory Visit | Attending: Family Medicine | Admitting: Family Medicine

## 2016-09-28 DIAGNOSIS — M8580 Other specified disorders of bone density and structure, unspecified site: Secondary | ICD-10-CM

## 2016-10-10 ENCOUNTER — Telehealth: Payer: Self-pay | Admitting: Radiology

## 2016-10-12 ENCOUNTER — Encounter: Payer: Self-pay | Admitting: Neurology

## 2016-10-16 ENCOUNTER — Telehealth: Payer: Self-pay | Admitting: Neurology

## 2016-10-16 NOTE — Telephone Encounter (Signed)
-----   Message from April H Pait sent at 10/10/2016 12:08 PM EDT ----- Regarding: RE: DAT Pt states [facility]. ----- Message ----- From: Silvio PateMcCracken, Roderica Cathell L, CMA Sent: 09/19/2016   2:10 PM To: April H Pait Subject: DAT                                            DAT Scan order entered.  Thanks so much!  Lesly RubensteinJade

## 2016-10-16 NOTE — Telephone Encounter (Signed)
Appt made with patient.  

## 2016-10-16 NOTE — Telephone Encounter (Signed)
DaT done at [facility] on 10/12/16 demonstrating grade 1:  Asymmetric uptake with normal or almost normal putamen activity on the right and more marked reduction in the left putamen.  Heather Brady, just make sure that patient has a f/u to discuss next steps and results of imaging.

## 2016-10-31 NOTE — Progress Notes (Signed)
Heather [facility] was seen today in the movement disorders clinic for neurologic consultation at the request of Dr. Vertell Limber.    Pt is accompanied by her daughter who supplements the history.  The consultation is for the evaluation of dizziness and RLS.  She previously saw Dr. Leta [facility] and I have taken an extensive amount of time to review his records.  The patient began to see Dr. Leta [facility] in June, 2016.  At that point, gait change, falls and balance issues have been going on for about 1 year.  She was started on a trial of levodopa in July, 2016.  She called back not long thereafter complaining about side effects, although records are unclear about what the side effects were.  Pt states that she had a rash and she d/c the med and restarted it and had a rash again.   She therefore stopped the medication.  She states that she doesn't know if the medication worked.  States that she was referred to Dr. Vertell Limber and he didn't recommend neck surgery.  She was told to come here for 2nd opinion.    Specific Symptoms:  Tremor: No. Family hx of similar:  No. Voice: no change Sleep: sleeps well per pt (not per daughter - after daughter probes her patient admits trouble staying asleep)  Vivid Dreams:  No.  Acting out dreams:  No. Wet Pillows: No. Postural symptoms:  Yes.    Falls?  Yes.   (last fall was few months ago getting into daughters truck when door hit her and she fell on butt; fell in 02/2015 and she hit side of curb and fell and hit head and had 6 stitches) Bradykinesia symptoms: slow movements and difficulty getting out of a chair Loss of smell:  No. Loss of taste:  No. Urinary Incontinence:  Yes.   (doesn't wear undergarments - urinary urgency) Difficulty Swallowing:  No. Handwriting, micrographia: No. (some difficulty writing due to shoulder surgery) Trouble with ADL's:  No.  Trouble buttoning clothing: No. Depression:  No. Memory changes:  Yes.   (short term loss) Hallucinations:   No.  visual distortions: No. N/V:  No. Lightheaded:  No.  Syncope: No. Diplopia:  No. Dyskinesia:  No.   11/02/16 update:  Patient seen today in follow-up.  This patient is accompanied in the office by her daughter who supplements the history.  She had a [facility]T done at [facility] on 10/12/16 demonstrating grade 1:  Asymmetric uptake with normal or almost normal putamen activity on the right and more marked reduction in the left putamen.  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  She has had multiple falls (3) since our last visit.  With one she leaned on bed for balance and there was a silky blanket that slid out from under her and she slid down.  With one, she was trying to get in the shower and she fell backward.  She didn't hit her head but did hit her back.  With the last one she was carrying stuff in her arms and lost her balance and hit the stove.  She has not gotten a walker.  Neuroimaging has previously been performed.  It available for my review today.  She had an MRI of the cervical spine in June, 2016.  I reviewed this.  There was some disc bulging at the C5-C6 level, but no spinal cord signal change.  MRI of the brain was also done in June, 2016.  I reviewed this as well.  There was mild to moderate small vessel disease.    PREVIOUS MEDICATIONS: Sinemet  ALLERGIES:   Allergies  Allergen Reactions  . Amoxicillin Itching  . Carbidopa-Levodopa Itching    CURRENT MEDICATIONS:  Outpatient Encounter Prescriptions as of 11/02/2016  Medication Sig  . amLODipine (NORVASC) 10 MG tablet Take 10 mg by mouth daily.  Marland Kitchen. esomeprazole (NEXIUM) 40 MG capsule Take 40 mg by mouth daily at 12 noon.  Marland Kitchen. HYDROcodone-acetaminophen (NORCO/VICODIN) 5-325 MG tablet Take 1 tablet by mouth every 6 (six) hours as needed for moderate pain.  Marland Kitchen. ibuprofen (ADVIL,MOTRIN) 800 MG tablet Take 800 mg by mouth 3 (three) times daily as needed.  Marland Kitchen. levothyroxine  (SYNTHROID, LEVOTHROID) 75 MCG tablet   . losartan (COZAAR) 50 MG tablet   . naproxen sodium (ANAPROX) 220 MG tablet Take 220 mg by mouth as needed.  . Vitamin D, Ergocalciferol, (DRISDOL) 50000 UNITS CAPS capsule TAKE ONE CAPSULE BY MOUTH EVERY 2 WEEKS   No facility-administered encounter medications on file as of 11/02/2016.     PAST MEDICAL HISTORY:   Past Medical History:  Diagnosis Date  . Fibromyalgia   . GERD (gastroesophageal reflux disease)   . Hypertension   . Kidney stones 1982    PAST SURGICAL HISTORY:   Past Surgical History:  Procedure Laterality Date  . carpel tunnel  2000   right  . NOSE SURGERY  1998   reconstruction  . OVARIAN CYST SURGERY  2011  . ROTATOR CUFF REPAIR Right     SOCIAL HISTORY:   Social History   Social History  . Marital status: Single    Spouse name: N/A  . Number of children: 2  . Years of education: 13   Occupational History  . retired     Product/process development scientistcafeteria supervisor   Social History Main Topics  . Smoking status: Never Smoker  . Smokeless tobacco: Never Used  . Alcohol use No  . Drug use: No  . Sexual activity: Not on file   Other Topics Concern  . Not on file   Social History Narrative   Lives at home alone   Drinks 16 oz tea daily    FAMILY HISTORY:   Family Status  Relation Status  . Mother Deceased at age 69  . Father Deceased at age 69  . Sister Alive  . Brother Alive  . Child Alive    ROS:  R leg drags compared to the left.  Denies feet paresthesias.  A complete 10 system review of systems was obtained and was unremarkable apart from what is mentioned above.  PHYSICAL EXAMINATION:    VITALS:   Vitals:   11/02/16 1011  BP: 124/60  Pulse: 87  SpO2: 99%  Weight: 182 lb (82.6 kg)  Height: 5\' 6"  (1.676 m)    GEN:  The patient appears stated age and is in NAD. HEENT:  Normocephalic, atraumatic.  The mucous membranes are moist. The superficial temporal arteries are without ropiness or tenderness.No tongue  fasciculations are noted. CV:  RRR Lungs:  CTAB Neck/HEME:  There are no carotid bruits bilaterally.  Neurological examination:  Orientation: The patient is alert and oriented x3. Fund of knowledge is appropriate.  Recent and remote memory are intact.  Attention and concentration are normal.    Able to name objects and repeat phrases. Cranial nerves: There is good facial symmetry. Pupils are equal round and reactive to light bilaterally. Fundoscopic exam reveals clear margins bilaterally. Extraocular muscles are  intact. The visual fields are full to confrontational testing. The speech is fluent and clear but she has trouble with the gutteral sounds.  There are no square wave jerks.   Soft palate rises symmetrically and there is no tongue deviation. Hearing is intact to conversational tone. Sensation: Sensation is intact to light and pinprick throughout (facial, trunk, extremities). Vibration is intact at the bilateral big toe. There is no extinction with double simultaneous stimulation. There is no sensory dermatomal level identified. Motor: Strength is 5/5 in the bilateral upper and lower extremities.   Shoulder shrug is equal and symmetric.  There is no pronator drift.  There are no fasciculations. Deep tendon reflexes: Deep tendon reflexes are 3/4 at the bilateral biceps, triceps, brachioradialis, patella and achilles. Plantar responses are downgoing bilaterally.  Movement examination: Tone: There is increased tone throughout, but more in the right upper and lower extremity compared to the left Abnormal movements: none seen.  Slight tremulousness can be felt Coordination:  There is decremation with virtually all forms of RAMs on the right.  This is different than last visit. Gait and Station: The patient has difficulty arising out of a deep-seated chair without the use of the hands and is unable to do this.  She requires assistance to get out of the chair today.  She requires a walker today and  is still slow and drags the right leg.   Labs:  The patient had lab work on 04/27/2016.  Sodium was 139, potassium 3.7, chloride 105, CO2 27, BUN 22, creatinine 1.02, AST 20, ALT 16, alkaline phosphatase 78, no M spike in the serum protein electrophoresis  No results found for: VITAMINB12   No results found for: WBC, HGB, HCT, MCV, PLT    ASSESSMENT/PLAN:  1.  Hyperreflexia, falls, gait instability, with mild pseudobulbar speech  -She had a DaT done at [facility] on 10/12/16 demonstrating grade 1:  Asymmetric uptake with normal or almost normal putamen activity on the right and more marked reduction in the left putamen.  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  Reports allergy to levodopa.  Told her that I don't think that she is actually allergic to levodopa, although it is certainly conceivable that she could be allergic to one of the fillers in the pill.  Talked to her about trying the CR version vs even rytary.   Pt states that she would like to try the CR version.  Will let me know if there is any rash.  -needs labs including Vit E, Zn++, anti-gliadin Ab's, B12, folate, RPR,  -needs to use ambulatory assistive device at all times.  Talked about fact that will need WC in future.    -will send for MBE  -will do home PT   2.  Constipation  -increase water intake  -rancho recipe give  3.  We will follow-up after the above has been completed.  Greater than 50% of the visit was spent in counseling and coordinating care.  Total visit time was 45 minutes.

## 2016-11-02 ENCOUNTER — Ambulatory Visit (INDEPENDENT_AMBULATORY_CARE_PROVIDER_SITE_OTHER): Payer: Medicare Other | Admitting: Neurology

## 2016-11-02 ENCOUNTER — Encounter: Payer: Self-pay | Admitting: Neurology

## 2016-11-02 ENCOUNTER — Other Ambulatory Visit: Payer: Medicare Other

## 2016-11-02 ENCOUNTER — Other Ambulatory Visit (HOSPITAL_COMMUNITY): Payer: Self-pay | Admitting: Neurology

## 2016-11-02 VITALS — BP 124/60 | HR 87 | Ht 66.0 in | Wt 182.0 lb

## 2016-11-02 DIAGNOSIS — G903 Multi-system degeneration of the autonomic nervous system: Secondary | ICD-10-CM

## 2016-11-02 DIAGNOSIS — R5383 Other fatigue: Secondary | ICD-10-CM | POA: Diagnosis not present

## 2016-11-02 DIAGNOSIS — R270 Ataxia, unspecified: Secondary | ICD-10-CM

## 2016-11-02 DIAGNOSIS — G239 Degenerative disease of basal ganglia, unspecified: Secondary | ICD-10-CM

## 2016-11-02 DIAGNOSIS — R296 Repeated falls: Secondary | ICD-10-CM | POA: Diagnosis not present

## 2016-11-02 DIAGNOSIS — R1319 Other dysphagia: Secondary | ICD-10-CM | POA: Diagnosis not present

## 2016-11-02 DIAGNOSIS — R1310 Dysphagia, unspecified: Secondary | ICD-10-CM

## 2016-11-02 MED ORDER — CARBIDOPA-LEVODOPA ER 25-100 MG PO TBCR: 1.0000 | EXTENDED_RELEASE_TABLET | Freq: Three times a day (TID) | ORAL | 3 refills | Status: DC

## 2016-11-02 NOTE — Patient Instructions (Addendum)
A.  Constipation and MSA:  1.Rancho recipe for constipation in MSA:  -1 cup of unprocessed bran (need to get this at [employer], [employer] or similar type of store), 2 cups of applesauce in 1 cup of prune juice 2.  Increase fiber intake (Metamucil,vegetables) 3.  Regular, moderate exercise can be beneficial. 4.  Avoid medications causing constipation, such as medications like antacids with calcium or magnesium 5.  Laxative overuse should be avoided. 6.  Stool softeners (Colace) can help with chronic constipation. 7.  Increase water intake.  You should be drinking 1/2 gallon of water a day as long as you have not been diagnosed with congestive heart failure or renal/kidney failure.  This is probably the single greatest thing that you can do to help your constipation.  B.  Start carbidopa/levodopa 25/100 CR one tablet twice per day 30 min before meals for a week and then increase to three times per day 30 min prior to meals  C. Your provider has requested that you have labwork completed today. Please go to [facility] ([id number]) on the second floor of this building before leaving the office today. You do not need to check in. If you are not called within 15 minutes please check with the front desk.   D. We have [facility] for your modified barium swallow on 11/15/16 at 11:30 am. Please arrive 15 minutes prior and go to 1st floor radiology. If you need to reschedule for any reason please call [phone].  E. We have sent a referral to [facility]. They will call you to schedule a time to start home health physical therapy.

## 2016-11-03 LAB — FOLATE

## 2016-11-03 LAB — RPR

## 2016-11-03 LAB — VITAMIN B12: Vitamin B-12: 407 pg/mL (ref 200–1100)

## 2016-11-06 LAB — GLIADIN ANTIBODIES, SERUM
GLIADIN IGG: 4 U (ref <20)
Gliadin IgA: 20 Units — ABNORMAL HIGH (ref <20)

## 2016-11-06 LAB — ZINC: ZINC: 74 ug/dL (ref 60–130)

## 2016-11-06 LAB — VITAMIN E
GAMMA-TOCOPHEROL (VIT E): 1.2 mg/L (ref <=4.3)
VITAMIN E (ALPHA TOCOPHEROL): 16.7 mg/L (ref 5.7–19.9)

## 2016-11-07 ENCOUNTER — Telehealth: Payer: Self-pay | Admitting: Neurology

## 2016-11-07 NOTE — Telephone Encounter (Signed)
Mychart message sent to patient.

## 2016-11-07 NOTE — Telephone Encounter (Signed)
-----   Message from Octaviano Battyebecca S Tat, DO sent at 11/07/2016  7:17 AM EDT ----- Send labs to PCP.  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

## 2016-11-09 ENCOUNTER — Telehealth: Payer: Self-pay | Admitting: Neurology

## 2016-11-09 NOTE — Telephone Encounter (Signed)
Leonel Ramsayob Larson, PT with [facility], called for orders to delay start of care until Friday 11/10/16. He was unable to connect with patient for start of care today. Called back and LMOM at [phone] with okay to delay.

## 2016-11-10 ENCOUNTER — Telehealth: Payer: Self-pay | Admitting: Neurology

## 2016-11-10 NOTE — Telephone Encounter (Signed)
PT called regarding medication update on how she is doing on it, side effects are she is out of it and sick to her stomach and 5th day of diarrhea, said it was the last prescription Dr tat prescribed for her and was not sure what it was called

## 2016-11-10 NOTE — Telephone Encounter (Signed)
We could try rytary samples if she would like (offer to her) but also tell her that given she doesn't have parkinsons disease but rather an atypical state, it may not help

## 2016-11-10 NOTE — Telephone Encounter (Signed)
Patient states she has been taking this with food three times a day.  She states the nausea starts about 3 hours after her first dose in the morning and lasts all day. She has not had any episodes of emesis.  States about an hour after taking each dose she develops diarrhea. This started last Sunday. She started medication on Friday.  Please advise.

## 2016-11-10 NOTE — Telephone Encounter (Signed)
Is she taking it with a carbohydrate?  This med usually doesn't cause diarrhea so that would be unusual.

## 2016-11-10 NOTE — Telephone Encounter (Signed)
Patient was started on Carbidopa Levodopa 25/100 CR. Please advise.

## 2016-11-10 NOTE — Telephone Encounter (Signed)
Spoke with patient. She will stick with this medication for another week and let us know how she does. Aware that she may should see PCP if diarrhea continues as this may be from a different etiology.

## 2016-11-14 ENCOUNTER — Telehealth: Payer: Self-pay | Admitting: Neurology

## 2016-11-14 NOTE — Telephone Encounter (Signed)
Spoke with Monique and gave verbal orders for physical therapy.

## 2016-11-14 NOTE — Telephone Encounter (Signed)
Monique called with Home Health and needs verbal orders on PT [phone]

## 2016-11-15 ENCOUNTER — Ambulatory Visit (HOSPITAL_COMMUNITY)
Admission: RE | Admit: 2016-11-15 | Discharge: 2016-11-15 | Disposition: A | Discharge status: Home / Self Care | Payer: Medicare Other | Source: Ambulatory Visit | Attending: Neurology | Admitting: Neurology

## 2016-11-15 DIAGNOSIS — R1310 Dysphagia, unspecified: Secondary | ICD-10-CM | POA: Insufficient documentation

## 2016-11-15 DIAGNOSIS — R1319 Other dysphagia: Secondary | ICD-10-CM | POA: Diagnosis present

## 2016-11-16 ENCOUNTER — Telehealth: Payer: Self-pay | Admitting: Neurology

## 2016-11-16 MED ORDER — CARBIDOPA-LEVODOPA ER 23.75-95 MG PO CPCR: 1.0000 | ORAL_CAPSULE | Freq: Three times a day (TID) | ORAL | 0 refills | Status: DC

## 2016-11-16 NOTE — Telephone Encounter (Signed)
Spoke with patient and urged her again to go to PCP to evaluate. She wants to now try Rytary and stop the Carbidopa Levodopa CR. She will come to the office to pick up samples - 1 tablet three times daily.

## 2016-11-16 NOTE — Telephone Encounter (Signed)
PT called and said she is still having diarrhea and wanted to know what to do

## 2017-01-15 ENCOUNTER — Encounter: Payer: Self-pay | Admitting: Neurology

## 2017-01-15 ENCOUNTER — Ambulatory Visit (INDEPENDENT_AMBULATORY_CARE_PROVIDER_SITE_OTHER): Payer: Medicare Other | Admitting: Neurology

## 2017-01-15 VITALS — BP 158/82 | HR 74

## 2017-01-15 DIAGNOSIS — G239 Degenerative disease of basal ganglia, unspecified: Secondary | ICD-10-CM | POA: Diagnosis not present

## 2017-01-15 DIAGNOSIS — G903 Multi-system degeneration of the autonomic nervous system: Secondary | ICD-10-CM

## 2017-01-15 DIAGNOSIS — R270 Ataxia, unspecified: Secondary | ICD-10-CM

## 2017-01-15 MED ORDER — CARBIDOPA-LEVODOPA ER 25-100 MG PO TBCR: 2.0000 | EXTENDED_RELEASE_TABLET | Freq: Three times a day (TID) | ORAL | 3 refills | Status: DC

## 2017-01-15 NOTE — Patient Instructions (Signed)
1.  Increase carbidopa/levodopa 25/100 CR as follows:  2 at 8am, 1 at noon, 1 at 5-6 pm for a week and then take 2 at 8am, 2 at noon, 1 at 5-6 pm for a week and then take 2 tablets three times a day thereafter.

## 2017-01-15 NOTE — Progress Notes (Signed)
Heather [facility] was seen today in the movement disorders clinic for neurologic consultation at the request of Dr. Vertell Limber.    Pt is accompanied by her daughter who supplements the history.  The consultation is for the evaluation of dizziness and RLS.  She previously saw Dr. Leta [facility] and I have taken an extensive amount of time to review his records.  The patient began to see Dr. Leta [facility] in June, 2016.  At that point, gait change, falls and balance issues have been going on for about 1 year.  She was started on a trial of levodopa in July, 2016.  She called back not long thereafter complaining about side effects, although records are unclear about what the side effects were.  Pt states that she had a rash and she d/c the med and restarted it and had a rash again.   She therefore stopped the medication.  She states that she doesn't know if the medication worked.  States that she was referred to Dr. Vertell [facility] and he didn't recommend neck surgery.  She was told to come here for 2nd opinion.    Specific Symptoms:  Tremor: No. Family hx of similar:  No. Voice: no change Sleep: sleeps well per pt (not per daughter - after daughter probes her patient admits trouble staying asleep)  Vivid Dreams:  No.  Acting out dreams:  No. Wet Pillows: No. Postural symptoms:  Yes.    Falls?  Yes.   (last fall was few months ago getting into daughters truck when door hit her and she fell on butt; fell in 02/2015 and she hit side of curb and fell and hit head and had 6 stitches) Bradykinesia symptoms: slow movements and difficulty getting out of a chair Loss of smell:  No. Loss of taste:  No. Urinary Incontinence:  Yes.   (doesn't wear undergarments - urinary urgency) Difficulty Swallowing:  No. Handwriting, micrographia: No. (some difficulty writing due to shoulder surgery) Trouble with ADL's:  No.  Trouble buttoning clothing: No. Depression:  No. Memory changes:  Yes.   (short term loss) Hallucinations:   No.  visual distortions: No. N/V:  No. Lightheaded:  No.  Syncope: No. Diplopia:  No. Dyskinesia:  No.   11/02/16 update:  Patient seen today in follow-up.  This patient is accompanied in the office by her daughter who supplements the history.  She had a DaT done at [facility] on 10/12/16 demonstrating grade 1:  Asymmetric uptake with normal or almost normal putamen activity on the right and more marked reduction in the left putamen.  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  She has had multiple falls (3) since our last visit.  With one she leaned on bed for balance and there was a silky blanket that slid out from under her and she slid down.  With one, she was trying to get in the shower and she fell backward.  She didn't hit her head but did hit her back.  With the last one she was carrying stuff in her arms and lost her balance and hit the stove.  She has not gotten a walker.  Neuroimaging has previously been performed.  It available for my review today.  She had an MRI of the cervical spine in June, 2016.  I reviewed this.  There was some disc bulging at the C5-C6 level, but no spinal cord signal change.  MRI of the brain was also done in June, 2016.  I reviewed this as well.  There was mild to moderate small vessel disease.    01/15/17 update:  Patient seen today in follow-up, accompanied by her daughter who supplements the history.  She was started on carbidopa/levodopa 25/100 CR last visit.  She called not long thereafter and stated that it caused diarrhea.  This would be somewhat unusual for this medication.  She also complained about nausea.  She states that she took 1 tablet of rytary and "it paralyzed me."  States that she couldn't move.  Went back to carbidopa/levodopa 25/100 CR.  No longer having diarrhea.  Was doing well with PT but now that not having PT, she is having more trouble.  Has had few falls.  One with walker.  2 involving  attempts to get in/out of tub.  Had swallow study since last visit.  This was normal.  PREVIOUS MEDICATIONS: Sinemet; rytary (took 1 pill and said it "paralyzed" her and she couldn't move")  ALLERGIES:   Allergies  Allergen Reactions  . Amoxicillin Itching  . Carbidopa-Levodopa Itching    CURRENT MEDICATIONS:  Outpatient Encounter Prescriptions as of 01/15/2017  Medication Sig  . amLODipine (NORVASC) 10 MG tablet Take 5 mg by mouth daily.   Marland Kitchen. esomeprazole (NEXIUM) 40 MG capsule Take 40 mg by mouth daily at 12 noon.  Marland Kitchen. HYDROcodone-acetaminophen (NORCO/VICODIN) 5-325 MG tablet Take 1 tablet by mouth every 6 (six) hours as needed for moderate pain.  Marland Kitchen. ibuprofen (ADVIL,MOTRIN) 800 MG tablet Take 800 mg by mouth 3 (three) times daily as needed.  Marland Kitchen. levothyroxine (SYNTHROID, LEVOTHROID) 75 MCG tablet   . losartan (COZAAR) 50 MG tablet   . naproxen sodium (ANAPROX) 220 MG tablet Take 220 mg by mouth as needed.  . Vitamin D, Ergocalciferol, (DRISDOL) 50000 UNITS CAPS capsule TAKE ONE CAPSULE BY MOUTH EVERY 2 WEEKS  . Carbidopa-Levodopa ER (SINEMET CR) 25-100 MG tablet controlled release Take 1 tablet by mouth 3 (three) times daily.  . [DISCONTINUED] Carbidopa-Levodopa ER (RYTARY) 23.75-95 MG CPCR Take 1 capsule by mouth 3 (three) times daily. (Patient not taking: Reported on 01/15/2017)   No facility-administered encounter medications on file as of 01/15/2017.     PAST MEDICAL HISTORY:   Past Medical History:  Diagnosis Date  . Fibromyalgia   . GERD (gastroesophageal reflux disease)   . Hypertension   . Kidney stones 1982    PAST SURGICAL HISTORY:   Past Surgical History:  Procedure Laterality Date  . carpel tunnel  2000   right  . NOSE SURGERY  1998   reconstruction  . OVARIAN CYST SURGERY  2011  . ROTATOR CUFF REPAIR Right     SOCIAL HISTORY:   Social History   Social History  . Marital status: Single    Spouse name: N/A  . Number of children: 2  . Years of education: 13    Occupational History  . retired     Product/process development scientistcafeteria supervisor   Social History Main Topics  . Smoking status: Never Smoker  . Smokeless tobacco: Never Used  . Alcohol use No  . Drug use: No  . Sexual activity: Not on file   Other Topics Concern  . Not on file   Social History Narrative   Lives at home alone   Drinks 16 oz tea daily    FAMILY HISTORY:   Family Status  Relation Status  . Mother Deceased at age 69  . Father Deceased at age 69  . Sister Alive  . Brother Alive  .  Child Alive    ROS:  R leg drags compared to the left.  Denies feet paresthesias.  A complete 10 system review of systems was obtained and was unremarkable apart from what is mentioned above.  PHYSICAL EXAMINATION:    VITALS:   Vitals:   01/15/17 1118  BP: (!) 158/82  Pulse: 74  SpO2: 99%    GEN:  The patient appears stated age and is in NAD. HEENT:  Normocephalic, atraumatic.  The mucous membranes are moist. The superficial temporal arteries are without ropiness or tenderness.No tongue fasciculations are noted. CV:  RRR Lungs:  CTAB Neck/HEME:  There are no carotid bruits bilaterally.  Neurological examination:  Orientation: The patient is alert and oriented x3. Fund of knowledge is appropriate.  Recent and remote memory are intact.  Attention and concentration are normal.    Able to name objects and repeat phrases. Cranial nerves: There is good facial symmetry. Speech is fluent and clear.  There are no square wave jerks.   Soft palate rises symmetrically and there is no tongue deviation. Hearing is intact to conversational tone. Sensation: Sensation is intact to light touch Motor: Strength is at least antigravity x 4 Deep tendon reflexes: Deep tendon reflexes are 3/4 at the bilateral biceps, triceps, brachioradialis, patella and achilles. Plantar responses are downgoing bilaterally.  Movement examination: Tone: There is normal tone today x 4, which is marked improvement Abnormal  movements: none seen or felt today (improved) Coordination:  There is decremation with virtually all forms of RAMs on the right.  This is different than last visit. Gait and Station: The patient arises by pushing up and requires mild assist.  She uses a walker.  She drags the right leg.     Labs:  The patient had lab work on 04/27/2016.  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  -still reports allergy to carbidopa/levodopa 25/100 IR but on carbidopa/levodopa 25/100 CR and doing well and looks better.  Will increase slowly dose to 2 po tid.  -needs to use ambulatory assistive device at all times.    -should not be bathing alone  -RX written for shower chair  -encouraged ACT but she cant get there.  Found PT helpful but did worse once ended.  Cannot get out of the house  -Patient had a normal swallow study on 11/15/2016.  -will do home PT   2.  Constipation  -increase water intake  -rancho recipe give  3.  Follow up is anticipated in the next few months, sooner should new neurologic issues arise.   Much greater than 50% of this visit was spent in counseling and coordinating care.  Total face to face time:  30 min

## 2017-01-31 ENCOUNTER — Telehealth: Payer: Self-pay | Admitting: Neurology

## 2017-01-31 NOTE — Telephone Encounter (Signed)
Her doctor in [place] wants her to see an endocrinologist. They wanted recommendations, since they do not usually refer patients here. I gave her information to [facility]- Dr. Elvera Lennox. She expressed appreciation and will call with additional questions.

## 2017-01-31 NOTE — Telephone Encounter (Signed)
pt's daughter Inetta Fermo left a VM message asking if Dr tat can recommend another doctor

## 2017-02-05 ENCOUNTER — Telehealth: Payer: Self-pay | Admitting: Neurology

## 2017-02-05 NOTE — Telephone Encounter (Signed)
Letter written. At front for pick up. Patient's daughter made aware.

## 2017-02-05 NOTE — Telephone Encounter (Signed)
Patient's daughter Inetta Fermo)  called regarding a Heather Brady Duty summons that her mother has received. The jury date is scheduled for 02/18/17. She is needing to know if she can get a letter from Dr. Arbutus Leas stating that she is disabled and cannot attend. Please Advise. Thanks

## 2017-03-05 ENCOUNTER — Encounter: Payer: Self-pay | Admitting: Podiatry

## 2017-03-05 ENCOUNTER — Ambulatory Visit (INDEPENDENT_AMBULATORY_CARE_PROVIDER_SITE_OTHER): Payer: Medicare Other | Admitting: Podiatry

## 2017-03-05 DIAGNOSIS — M79674 Pain in right toe(s): Secondary | ICD-10-CM

## 2017-03-05 DIAGNOSIS — M79675 Pain in left toe(s): Secondary | ICD-10-CM

## 2017-03-05 DIAGNOSIS — M722 Plantar fascial fibromatosis: Secondary | ICD-10-CM | POA: Diagnosis not present

## 2017-03-05 DIAGNOSIS — B351 Tinea unguium: Secondary | ICD-10-CM | POA: Diagnosis not present

## 2017-03-05 NOTE — Progress Notes (Signed)
Subjective:    Patient ID: Heather Brady, female    DOB: [birth year], 71 y.o.   MRN: [id number]  HPI  Heather Brady presents the also daughter for concerns of thick, discolored, elongated tenderness or curling and causing pain. Denies any redness or drainage coming from the toenail sites. She is asking for tetanus patient today. She also states that she has to cram to the arch of her foot on the right side this weekend and is intermittent. She is tenderness and tightness on the bottom of her foot is well. She states that which was wearing on her foot the pain resolves. No recent injury or trauma. She has no other concerns.  Review of Systems  All other systems reviewed and are negative.  Past Medical History:  Diagnosis Date  . Fibromyalgia   . GERD (gastroesophageal reflux disease)   . Hypertension   . Kidney stones 1982    Past Surgical History:  Procedure Laterality Date  . carpel tunnel  2000   right  . NOSE SURGERY  1998   reconstruction  . OVARIAN CYST SURGERY  2011  . ROTATOR CUFF REPAIR Right      Current Outpatient Prescriptions:  .  amLODipine (NORVASC) 10 MG tablet, Take 5 mg by mouth daily. , Disp: , Rfl:  .  Carbidopa-Levodopa ER (SINEMET CR) 25-100 MG tablet controlled release, Take 2 tablets by mouth 3 (three) times daily., Disp: 180 tablet, Rfl: 3 .  esomeprazole (NEXIUM) 40 MG capsule, Take 40 mg by mouth daily at 12 noon., Disp: , Rfl:  .  HYDROcodone-acetaminophen (NORCO/VICODIN) 5-325 MG tablet, Take 1 tablet by mouth every 6 (six) hours as needed for moderate pain., Disp: 15 tablet, Rfl: 0 .  ibuprofen (ADVIL,MOTRIN) 800 MG tablet, Take 800 mg by mouth 3 (three) times daily as needed., Disp: , Rfl: 1 .  levothyroxine (SYNTHROID, LEVOTHROID) 75 MCG tablet, , Disp: , Rfl:  .  losartan (COZAAR) 50 MG tablet, , Disp: , Rfl:  .  naproxen sodium (ANAPROX) 220 MG tablet, Take 220 mg by mouth as needed., Disp: , Rfl:  .  Vitamin D, Ergocalciferol, (DRISDOL) 50000 UNITS  CAPS capsule, TAKE ONE CAPSULE BY MOUTH EVERY 2 WEEKS, Disp: , Rfl: 5  Allergies  Allergen Reactions  . Amoxicillin Itching  . Carbidopa-Levodopa Itching    Social History   Social History  . Marital status: Single    Spouse name: N/A  . Number of children: 2  . Years of education: 13   Occupational History  . retired     Product/process development scientistcafeteria supervisor   Social History Main Topics  . Smoking status: Never Smoker  . Smokeless tobacco: Never Used  . Alcohol use No  . Drug use: No  . Sexual activity: Not on file   Other Topics Concern  . Not on file   Social History Narrative   Lives at home alone   Drinks 16 oz tea daily         Objective:   Physical Exam General: AAO x3, NAD  Dermatological: Nails are hypertrophic, dystrophic, brittle, discolored, elongated 10. No surrounding redness or drainage. Tenderness nails 1-5 bilaterally. No open lesions or pre-ulcerative lesions are identified today.  Vascular: Dorsalis Pedis artery and Posterior Tibial artery pedal pulses are 2/4 bilateral with immedate capillary fill time.  There is no pain with calf compression, swelling, warmth, erythema.   Neruologic: Grossly intact via light touch bilateral.Protective threshold with Semmes Wienstein monofilament intact to all pedal sites bilateral.  Musculoskeletal: There is tenderness palpation along the medial band of plantar fascia and the arch of foot on the right and has very tightness of this ligament. Plantar fascia. We intact. There is no area pinpoint bony tenderness or pain the vibratory sensation. There is no pain with lateral compression of the calcaneus. Equinus present. Upon dorsiflexion of her ankle she states that she feels appointment sensation to the bottom of her foot along the plantar fascia. No other areas of tenderness are identified. There is no overlying edema, erythema, increase in warmth.      Assessment & Plan:  71 year old female symptomatic onychomycosis, plantar  fasciitis -Treatment options discussed including all alternatives, risks, and complications -Etiology of symptoms were discussed -Nails debrided 10 without complications or bleeding. Discussed treatment options for nail fungus they wish to hold off. -Discussed stretching and icing exercises for the heel daily. Wishes to hold off on steroid injection today.  -Daily foot inspection -Follow-up in 3 months or sooner if any problems arise. In the meantime, encouraged to call the office with any questions, concerns, change in symptoms.   Ovid Curd, DPM

## 2017-03-08 ENCOUNTER — Ambulatory Visit: Payer: Medicare Other | Admitting: Neurology

## 2017-04-12 NOTE — Progress Notes (Signed)
Heather Brady was seen today in the movement disorders clinic for neurologic consultation at the request of Dr. Vertell Limber.    Pt is accompanied by her daughter who supplements the history.  The consultation is for the evaluation of dizziness and RLS.  She previously saw Dr. Leta Baptist and I have taken an extensive amount of time to review his records.  The patient began to see Dr. Leta Baptist in June, 2016.  At that point, gait change, falls and balance issues have been going on for about 1 year.  She was started on a trial of levodopa in July, 2016.  She called back not long thereafter complaining about side effects, although records are unclear about what the side effects were.  Pt states that she had a rash and she d/c the med and restarted it and had a rash again.   She therefore stopped the medication.  She states that she doesn't know if the medication worked.  States that she was referred to Dr. Vertell Limber and he didn't recommend neck surgery.  She was told to come here for 2nd opinion.    Specific Symptoms:  Tremor: No. Family hx of similar:  No. Voice: no change Sleep: sleeps well per pt (not per daughter - after daughter probes her patient admits trouble staying asleep)  Vivid Dreams:  No.  Acting out dreams:  No. Wet Pillows: No. Postural symptoms:  Yes.    Falls?  Yes.   (last fall was few months ago getting into daughters truck when door hit her and she fell on butt; fell in 02/2015 and she hit side of curb and fell and hit head and had 6 stitches) Bradykinesia symptoms: slow movements and difficulty getting out of a chair Loss of smell:  No. Loss of taste:  No. Urinary Incontinence:  Yes.   (doesn't wear undergarments - urinary urgency) Difficulty Swallowing:  No. Handwriting, micrographia: No. (some difficulty writing due to shoulder surgery) Trouble with ADL's:  No.  Trouble buttoning clothing: No. Depression:  No. Memory changes:  Yes.   (short term loss) Hallucinations:   No.  visual distortions: No. N/V:  No. Lightheaded:  No.  Syncope: No. Diplopia:  No. Dyskinesia:  No.   11/02/16 update:  Patient seen today in follow-up.  This patient is accompanied in the office by her daughter who supplements the history.  She had a DaT done at [facility] on 10/12/16 demonstrating grade 1:  Asymmetric uptake with normal or almost normal putamen activity on the right and more marked reduction in the left putamen.  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  She has had multiple falls (3) since our last visit.  With one she leaned on bed for balance and there was a silky blanket that slid out from under her and she slid down.  With one, she was trying to get in the shower and she fell backward.  She didn't hit her head but did hit her back.  With the last one she was carrying stuff in her arms and lost her balance and hit the stove.  She has not gotten a walker.  Neuroimaging has previously been performed.  It available for my review today.  She had an MRI of the cervical spine in June, 2016.  I reviewed this.  There was some disc bulging at the C5-C6 level, but no spinal cord signal change.  MRI of the brain was also done in June, 2016.  I reviewed this as well.  There was mild to moderate small vessel disease.    01/15/17 update:  Patient seen today in follow-up, accompanied by her daughter who supplements the history.  She was started on carbidopa/levodopa 25/100 CR last visit.  She called not long thereafter and stated that it caused diarrhea.  This would be somewhat unusual for this medication.  She also complained about nausea.  She states that she took 1 tablet of rytary and "it paralyzed me."  States that she couldn't move.  Went back to carbidopa/levodopa 25/100 CR.  No longer having diarrhea.  Was doing well with PT but now that not having PT, she is having more trouble.  Has had few falls.  One with walker.  2 involving  attempts to get in/out of tub.  Had swallow study since last visit.  This was normal.  04/13/17 update: Patient seen today in follow-up.  She is accompanied by her daughter who supplements the history.  Patients levodopa was increased last visit so that she is now taking carbidopa/Levodopa 25/100 CR, 2 tablets 3 times per day.  She had 4 falls in the last week but prior to that it had been a while.  With the first fall, she was at the sink and getting in the shower and lost balance.  She has a "love seat" for the bathtub.  She doesn't have anyone to help her bathe.  With the next one, she was in the kitchen and she was trying to move step down to go down from the dining room to the laundry room and she fell (its a converted garage and has a small step).  With the next one (the same day), she was going back to her bedroom and she fell with her walker.   With the next, she was trying to get out of a service mans way and was moving backward and she fell.  No diplopia.  No swallowing issues.    PREVIOUS MEDICATIONS: Sinemet; rytary (took 1 pill and said it "paralyzed" her and she couldn't move")  ALLERGIES:   Allergies  Allergen Reactions  . Amoxicillin Itching  . Carbidopa-Levodopa Itching    CURRENT MEDICATIONS:  Outpatient Encounter Medications as of 04/13/2017  Medication Sig  . amLODipine (NORVASC) 10 MG tablet Take 5 mg by mouth daily.   . Carbidopa-Levodopa ER (SINEMET CR) 25-100 MG tablet controlled release Take 2 tablets by mouth 3 (three) times daily.  Marland Kitchen esomeprazole (NEXIUM) 40 MG capsule Take 40 mg by mouth daily at 12 noon.  Marland Kitchen HYDROcodone-acetaminophen (NORCO/VICODIN) 5-325 MG tablet Take 1 tablet by mouth every 6 (six) hours as needed for moderate pain.  Marland Kitchen ibuprofen (ADVIL,MOTRIN) 800 MG tablet Take 800 mg by mouth 3 (three) times daily as needed.  Marland Kitchen levothyroxine (SYNTHROID, LEVOTHROID) 75 MCG tablet   . losartan (COZAAR) 50 MG tablet   . naproxen sodium (ANAPROX) 220 MG tablet Take  220 mg by mouth as needed.  . Vitamin D, Ergocalciferol, (DRISDOL) 50000 UNITS CAPS capsule TAKE ONE CAPSULE BY MOUTH EVERY 2 WEEKS   No facility-administered encounter medications on file as of 04/13/2017.     PAST MEDICAL HISTORY:   Past Medical History:  Diagnosis Date  . Fibromyalgia   . GERD (gastroesophageal reflux disease)   . Hypertension   . Kidney stones 1982    PAST SURGICAL HISTORY:   Past Surgical History:  Procedure Laterality Date  . carpel tunnel  2000   right  . NOSE SURGERY  1998   reconstruction  . OVARIAN CYST SURGERY  2011  . ROTATOR CUFF REPAIR Right     SOCIAL HISTORY:   Social History   Socioeconomic History  . Marital status: Single    Spouse name: Not on file  . Number of children: 2  . Years of education: 17  . Highest education level: Not on file  Social Needs  . Financial resource strain: Not on file  . Food insecurity - worry: Not on file  . Food insecurity - inability: Not on file  . Transportation needs - medical: Not on file  . Transportation needs - non-medical: Not on file  Occupational History  . Occupation: retired    Comment: Radio producer  Tobacco Use  . Smoking status: Never Smoker  . Smokeless tobacco: Never Used  Substance and Sexual Activity  . Alcohol use: No  . Drug use: No  . Sexual activity: Not on file  Other Topics Concern  . Not on file  Social History Narrative   Lives at home alone   Drinks 16 oz tea daily    FAMILY HISTORY:   Family Status  Relation Name Status  . Mother  Deceased at age 62  . Father  Deceased at age 81  . Sister x3 Alive  . Brother 1 Alive  . Child x2 Alive    ROS:  R leg drags compared to the left.  Denies feet paresthesias.  A complete 10 system review of systems was obtained and was unremarkable apart from what is mentioned above.  PHYSICAL EXAMINATION:    VITALS:   Vitals:   04/13/17 1434  BP: 120/64  Pulse: 86  SpO2: 98%    GEN:  The patient appears stated  age and is in NAD. HEENT:  Normocephalic, atraumatic.  The mucous membranes are moist. The superficial temporal arteries are without ropiness or tenderness.No tongue fasciculations are noted. CV:  RRR Lungs:  CTAB Neck/HEME:  There are no carotid bruits bilaterally.  Neurological examination:  Orientation: The patient is alert and oriented x3. Fund of knowledge is appropriate.  Recent and remote memory are intact.  Attention and concentration are normal.    Able to name objects and repeat phrases. Cranial nerves: There is good facial symmetry. Speech is fluent and clear.  There are no square wave jerks.   Soft palate rises symmetrically and there is no tongue deviation. Hearing is intact to conversational tone. Sensation: Sensation is intact to light touch Motor: Strength is at least antigravity x 4 Deep tendon reflexes: Deep tendon reflexes are 3/4 at the bilateral biceps, triceps, brachioradialis, patella and achilles. Plantar responses are downgoing bilaterally.  Pt placed in gait belt and given walker.    Movement examination: Tone: There is mild increased tone in the rue Abnormal movements: none seen or felt today (improved) Coordination:  There is decremation with virtually all forms of RAMs on the right.  This is different than last visit. Gait and Station: The patient arises by pushing up and requires mild assist.  The examiner held the gait belt.  The right leg is extended at the knee.  She drags the right leg     Labs:  The patient had lab work on 04/27/2016.  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  -still reports allergy to carbidopa/levodopa 25/100 IR but on carbidopa/levodopa 25/100 CR and looked better after starting.  Pt/daughter don't think that doing much now.  Told we could stop it or do levodopa challenge.  They decided to continue.  Risks, benefits, side effects and alternative therapies were discussed.  The opportunity to ask questions was given and they were answered to the best of my ability.  The patient expressed understanding and willingness to follow the outlined treatment protocols.  -discussed that she needs to stay seated unless someone directly with her to ambulate.  She has had too many falls.  Discussed morbidity and mortality  -discussed again home situation for future.  I don't think that the idea of living alone on long term is going to work and told her that I need her/family to come up with alternative option  -discussed advanced directives.  She doesn't have them.  Discussed need for them.    -Patient had a normal swallow study on 11/15/2016.  -invited to new atypical parkinsonism support group starting in Jan  -met with our social worker today  2.  Constipation  -increase water intake  -rancho recipe give  3.  Follow up is anticipated in the next few months, sooner should new neurologic issues arise.  Much greater than 50% of this visit was spent in counseling and coordinating care.  Total face to face time:  30 min

## 2017-04-13 ENCOUNTER — Ambulatory Visit: Payer: Medicare Other | Admitting: Neurology

## 2017-04-13 ENCOUNTER — Encounter: Payer: Self-pay | Admitting: Psychology

## 2017-04-13 VITALS — BP 120/64 | HR 86

## 2017-04-13 DIAGNOSIS — G231 Progressive supranuclear ophthalmoplegia [Steele-Richardson-Olszewski]: Secondary | ICD-10-CM | POA: Diagnosis not present

## 2017-04-13 NOTE — Progress Notes (Signed)
I met with the patient and her daughter today while they were in clinic. I gently discussed levels of care how proactively talking about this and planning can help to ensure safety and maximize independence.    Resources were provided on:   Anticipatory grief counseling  Upcoming AP support group  Transportation resources through [facility] and [facility].  The patient and daughter were a little reserved during our visit. I gave both of them my contact information and am available at any point during this journey.  

## 2017-04-17 ENCOUNTER — Encounter: Payer: Self-pay | Admitting: Neurology

## 2017-04-19 ENCOUNTER — Ambulatory Visit: Payer: Medicare Other | Admitting: Internal Medicine

## 2017-05-17 ENCOUNTER — Other Ambulatory Visit: Payer: Self-pay | Admitting: Neurology

## 2017-06-04 ENCOUNTER — Ambulatory Visit: Payer: Medicare Other | Admitting: Podiatry

## 2017-06-04 DIAGNOSIS — M79674 Pain in right toe(s): Secondary | ICD-10-CM | POA: Diagnosis not present

## 2017-06-04 DIAGNOSIS — M79675 Pain in left toe(s): Secondary | ICD-10-CM

## 2017-06-04 DIAGNOSIS — B351 Tinea unguium: Secondary | ICD-10-CM | POA: Diagnosis not present

## 2017-06-06 NOTE — Progress Notes (Signed)
Subjective: 72 y.o. returns the office today for painful, elongated, thickened toenails which she cannot trim herself. Denies any redness or drainage around the nails. Denies any acute changes since last appointment and no new complaints today. Denies any systemic complaints such as fevers, chills, nausea, vomiting.   PCP: Lanice SchwabNymberg, Jerome H, MD  Objective: AAO 3, NAD DP/PT pulses palpable, CRT less than 3 seconds Nails hypertrophic, dystrophic, elongated, brittle, discolored 10. There is tenderness overlying the nails 1-5 bilaterally. There is no surrounding erythema or drainage along the nail sites. No open lesions or pre-ulcerative lesions are identified. No pain along the plantar fascia or heels today. She states this has resolved.  No other areas of tenderness bilateral lower extremities. No overlying edema, erythema, increased warmth. No pain with calf compression, swelling, warmth, erythema.  Assessment: Patient presents with symptomatic onychomycosis  Plan: -Treatment options including alternatives, risks, complications were discussed -Nails sharply debrided 10 without complication/bleeding. -Lateral hallux nails appear to be the worst we discussed nail avulsion.  She will think about this. -Discussed daily foot inspection. If there are any changes, to call the office immediately.  -Follow-up in 3 months or sooner if any problems are to arise. In the meantime, encouraged to call the office with any questions, concerns, changes symptoms.  Ovid CurdMatthew Elenore Wanninger, DPM

## 2017-06-08 ENCOUNTER — Ambulatory Visit: Payer: Medicare Other | Admitting: Internal Medicine

## 2017-06-08 ENCOUNTER — Encounter: Payer: Self-pay | Admitting: Internal Medicine

## 2017-06-08 LAB — PHOSPHORUS: PHOSPHORUS: 2.7 mg/dL (ref 2.3–4.6)

## 2017-06-08 LAB — VITAMIN D 25 HYDROXY (VIT D DEFICIENCY, FRACTURES): VITD: 50.06 ng/mL (ref 30.00–100.00)

## 2017-06-08 LAB — MAGNESIUM: Magnesium: 1.7 mg/dL (ref 1.5–2.5)

## 2017-06-08 NOTE — Progress Notes (Signed)
Patient ID: Heather Brady, female   DOB: May 08, 1946, 72 y.o.   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  She is here with her daughter who offers part of the history.  Pt was dx with hypercalcemia at least since 2017.    She also has a history of chronic kidney disease stage III.    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  She is seen  for her abnormal gait/ movement disorder at [facility]. The condition is getting worse. Now walks with a walker.  I reviewed pt's pertinent labs: 01/25/2017: Calcium 11.3, ionized calcium 5.9, intact PTH 43, PTH related peptide <2.0, vitamin D 25-hydroxy 48, BUN/creatinine  14/0.82; SPEP; polyclonal hypergammaglobulinemia; serum kappa/lambda light chain ratio normal ([facility] was consulted and they recommended repeat SPEP in 6 months) 11/30/2016: Calcium 10.4, ionized calcium 5.7, TSH 0.719, alkaline phosphatase 80 04/27/2016: Calcium 10.5, alkaline phosphatase 78 01/27/2016: Calcium 10.9 11/29/2015: Calcium 10.4 No results found for: PTH, CALCIUM  I reviewed pt's DEXA scans: Date L1-L4 T score FN T score FRAX  08/2016 ([facility]) +0.3 RFN: -1.8 RFN: -1.8 MOF: 10.5% 10-year risk of hip fracture: 1.7%   No fractures. She fell and dislocated her shoulder ~5 years ago.  + h/o kidney stones in 1969.  + CKD stage 3. Last BUN/Cr: 01/25/2017: BUN/creatinine  14/0.82 No results found for: BUN, CREATININE  Pt is not on HCTZ.  + h/o vitamin D deficiency. Reviewed vit D levels: 01/25/2017: Vitamin D 48 No results found for: VD25OH  She is on Ergocalciferol q2 weeks.  Pt does not have a FH of hypercalcemia, pituitary tumors, thyroid cancer, or osteoporosis.   She also has a history of controlled  hypothyroidism.  ROS: Constitutional: no weight gain/loss, no fatigue, no subjective hyperthermia/hypothermia, + poor sleep, + nocturia x2 Eyes: no blurry vision, no xerophthalmia ENT: no sore throat, no nodules palpated in throat, no dysphagia/odynophagia, no hoarseness Cardiovascular: no CP/SOB/palpitations/+ leg swelling Respiratory: no cough/SOB Gastrointestinal: no N/V/D/+ C Musculoskeletal: no muscle/joint aches Skin: no rashes, + hair loss, + easy bruising Neurological: no tremors/numbness/tingling/dizziness Psychiatric: no depression/anxiety+  Past Medical History:  Diagnosis Date  . Fibromyalgia   . GERD (gastroesophageal reflux disease)   . Hypertension   . Kidney stones 1982   Past Surgical History:  Procedure Laterality Date  . carpel tunnel  2000   right  . NOSE SURGERY  1998   reconstruction  . OVARIAN CYST SURGERY  2011  . ROTATOR CUFF REPAIR Right    Social History   Socioeconomic History  . Marital status: Single    Spouse name: Not on file  . Number of children: 2  . Years of education: 51  Social Needs  Occupational History  . Occupation: retired    Comment: Radio producer  Tobacco Use  . Smoking status: Never Smoker  . Smokeless tobacco: Never Used  Substance and Sexual Activity  . Alcohol use: No  . Drug use: No  Social History Narrative   Lives at home alone   Drinks 16 oz tea daily   Current Outpatient Medications on File Prior to Visit  Medication Sig Dispense Refill  . amLODipine (NORVASC) 10 MG tablet Take 5 mg by mouth daily.     . Carbidopa-Levodopa ER (SINEMET CR) 25-100 MG tablet controlled  release TAKE 2 TABLETS BY MOUTH 3 TIMES A DAY 180 tablet 3  . esomeprazole (NEXIUM) 40 MG capsule Take 40 mg by mouth daily at 12 noon.    Marland Kitchen HYDROcodone-acetaminophen (NORCO/VICODIN) 5-325 MG tablet Take 1 tablet by mouth every 6 (six) hours as needed for moderate pain. 15 tablet 0  . levothyroxine (SYNTHROID, LEVOTHROID) 75 MCG tablet      . losartan (COZAAR) 50 MG tablet     . Melatonin 10 MG CAPS Take 15 mg by mouth.    . Multiple Vitamin (MULTIVITAMIN) LIQD Take 5 mLs by mouth daily.    . naproxen sodium (ANAPROX) 220 MG tablet Take 220 mg by mouth as needed.    . Vitamin D, Ergocalciferol, (DRISDOL) 50000 UNITS CAPS capsule TAKE ONE CAPSULE BY MOUTH EVERY 2 WEEKS  5  . ibuprofen (ADVIL,MOTRIN) 800 MG tablet Take 800 mg by mouth 3 (three) times daily as needed.  1   No current facility-administered medications on file prior to visit.    Allergies  Allergen Reactions  . Amoxicillin Itching  . Carbidopa-Levodopa Itching   Family History  Problem Relation Age of Onset  . Healthy Mother   . Healthy Father     PE: BP 112/60   Pulse 82   Ht 5' 5.25" (1.657 m)   Wt 169 lb 6.4 oz (76.8 kg)   SpO2 97%   BMI 27.97 kg/m  Wt Readings from Last 3 Encounters:  06/08/17 169 lb 6.4 oz (76.8 kg)  11/02/16 182 lb (82.6 kg)  07/21/16 184 lb (83.5 kg)   Constitutional: overweight, in NAD. Patient is in a wheelchair.  She appears very pale. Eyes: PERRLA, EOMI, no exophthalmos ENT: moist mucous membranes, no thyromegaly, no cervical lymphadenopathy Cardiovascular: RRR, No MRG Respiratory: CTA B Gastrointestinal: abdomen soft, NT, ND, BS+ Musculoskeletal: no deformities, weakness in bilateral upper extremities Skin: moist, warm, no rashes Neurological: no tremor with outstretched hands, DTR not tested.  Assessment: 1. Hypercalcemia/hyperparathyroidism  Plan: Patient has had several instances of elevated calcium, with the highest level being at 11.3. A corresponding intact PTH level was not suppressed, at 45.  - Patient also  has vitamin D deficiency, but latest level normal, on Ergocalciferol 50,000 units every 2 weeks - No apparent complications from hypercalcemia: she has a h/o nephrolithiasis - but very distant, no osteoporosis - but osteopenia, no fractures. No abdominal pain, depression, bone pain. Has  constipations. - I discussed with the patient about the physiology of calcium and parathyroid hormone, and possible side effects from increased PTH, including kidney stones, osteoporosis, abdominal pain, etc.  - We discussed that we need to check whether her hyperparathyroidism is primary (Familial hypercalcemic hypocalciuria or parathyroid adenoma) or secondary (to conditions like: vitamin D deficiency, calcium malabsorption, hypercalciuria, renal insufficiency, etc.). - I reviewed the labs obtained by PCP in 01/2017: At that time, both total and ionized calcium were elevated, her vitamin D was normal, PTH was suppressed, PTH rp was not elevated, SPEP was not indicative of multiple myeloma.  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 - At this visit, w I will expand the investigation a little, to contain: Calcium level intact PTH ([facility]) Magnesium Phosphorus vitamin D- 25 HO and 1,25 HO 24h urinary calcium/creatinine ratio - given instructions for urine collection - We discussed possible consequences of hyperparathyroidism: ~1/3 pts will develop complications over 15 years (OP, nephrolithiasis).  - If the tests indicate a parathyroid adenoma,  we can go ahead  with parathyroidectomy, but they would like to think about this.  I advised him that this is not urgent so we decided to have her back in 6 months and recheck her labs to notice trend in calcium and PTH and discuss again about possible need for surgery. - criteria for parathyroid surgery:  Increased calcium by more than 1 mg/dL above the upper limit of normal  Kidney ds.  Osteoporosis (or Vb fx) Age <75 years old Newer criteria (2013): High UCa >400 mg/d and increased stone risk by biochemical stone risk analysis Presence of nephrolithiasis or nephrocalcinosis Pt's preference!  - I will advise her about a possible change in the vitamin D supplement dose when the results of the vitamin D  level are back. - Advised her to stay well-hydrated - I will see the patient back in 6 months  Orders Placed This Encounter  Procedures  . VITAMIN D 25 Hydroxy (Vit-D Deficiency, Fractures)  . Phosphorus  . Magnesium  . Creatinine, urine, 24 hour  . Calcium, urine, 24 hour  . BASIC METABOLIC PANEL WITH GFR  . Parathyroid hormone, intact (no Ca)  . Vitamin D 1,25 dihydroxy   Component     Latest Ref Rng & Units 06/08/2017 06/11/2017  Glucose     65 - 99 mg/dL 85   BUN     7 - 25 mg/dL 19   Creatinine     0.50 - 0.99 mg/dL 1.11 (H)   GFR, Est Non African American     > OR = 60 mL/min/1.34m 51 (L)   GFR, Est African American     > OR = 60 mL/min/1.758m59 (L)   BUN/Creatinine Ratio     6 - 22 (calc) 17   Sodium     135 - 146 mmol/L 139   Potassium     3.5 - 5.3 mmol/L 3.7   Chloride     98 - 110 mmol/L 105   CO2     20 - 32 mmol/L 26   Calcium     8.6 - 10.4 mg/dL 10.5 (H)   Vitamin D 1, 25 (OH) Total     18 - 72 pg/mL 50   Vitamin D3 1, 25 (OH)     pg/mL 21   Vitamin D2 1, 25 (OH)     pg/mL 29   VITD     30.00 - 100.00 ng/mL 50.06   Phosphorus     2.3 - 4.6 mg/dL 2.7   Magnesium     1.5 - 2.5 mg/dL 1.7   PTH, Intact     15 - 65 pg/mL 43   specimen status report      Comment   Calcium, 24H Urine     mg/24 h  197  Creatinine, 24H Ur     0.50 - 2.15 g/24 h  1.38   Only slightly high calcium level with nonsuppressed PTH, in the normal range.  Normal vitamin D and calcitriol levels.  Normal magnesium and phosphorus.  Normal 24-hour urine calcium.  Kidney function slightly low she has a history of  Stage III CKD.  We will repeat the results at next visit and discuss about further plan then.  CrPhilemon KingdomMD PhD [facility]

## 2017-06-08 NOTE — Patient Instructions (Addendum)
Please stop at the lab.  Please come back for a follow-up appointment in 6 months.  Hypercalcemia Hypercalcemia is having too much calcium in the blood. The body needs calcium to make bones and keep them strong. Calcium also helps the muscles, nerves, brain, and heart work the way they should. Most of the calcium in the body is in the bones. There is also some calcium in the blood. Hypercalcemia can happen when calcium comes out of the bones, or when the kidneys are not able to remove calcium from the blood. Hypercalcemia can be mild or severe. What are the causes? There are many possible causes of hypercalcemia. Common causes include:  Hyperparathyroidism. This is a condition in which the body produces too much parathyroid hormone. There are four parathyroid glands in your neck. These glands produce a chemical messenger (hormone) that helps the body absorb calcium from foods and helps your bones release calcium.  Certain kinds of cancer, such as lung cancer, breast cancer, or myeloma.  Less common causes of hypercalcemia include:  Getting too much calcium or vitamin D from your diet.  Kidney failure.  Hyperthyroidism.  Being on bed rest for a long time.  Certain medicines.  Infections.  Sarcoidosis.  What increases the risk? This condition is more likely to develop in:  Women.  People who are 60 years or older.  People who have a family history of hypercalcemia.  What are the signs or symptoms? Mild hypercalcemia that starts slowly may not cause symptoms. Severe, sudden hypercalcemia is more likely to cause symptoms, such as:  Loss of appetite.  Increased thirst and frequent urination.  Fatigue.  Nausea and vomiting.  Headache.  Abdominal pain.  Muscle pain, twitching, or weakness.  Constipation.  Blood in the urine.  Pain in the side of the back (flank pain).  Anxiety, confusion, or depression.  Irregular heartbeat (arrhythmia).  Loss of  consciousness.  How is this diagnosed? This condition may be diagnosed based on:  Your symptoms.  Blood tests.  Urine tests.  X-rays.  Ultrasound.  MRI.  CT scan.  How is this treated? Treatment for hypercalcemia depends on the cause. Treatment may include:  Receiving fluids through an IV tube.  Medicines that keep calcium levels steady after receiving fluids (loop diuretics).  Medicines that keep calcium in your bones (bisphosphonates).  Medicines that lower the calcium level in your blood.  Surgery to remove overactive parathyroid glands.  Follow these instructions at home:  Take over-the-counter and prescription medicines only as told by your health care provider.  Follow instructions from your health care provider about eating or drinking restrictions.  Drink enough fluid to keep your urine clear or pale yellow.  Stay active. Weight-bearing exercise helps to keep calcium in your bones. Follow instructions from your health care provider about what type and level of exercise is safe for you.  Keep all follow-up visits as told by your health care provider. This is important. Contact a health care provider if:  You have a fever.  You have flank or abdominal pain that is getting worse. Get help right away if:  You have severe abdominal or flank pain.  You have chest pain.  You have trouble breathing.  You become very confused and sleepy.  You lose consciousness. This information is not intended to replace advice given to you by your health care provider. Make sure you discuss any questions you have with your health care provider. Document Released: 07/08/2004 Document Revised: 09/30/2015 Document Reviewed:  09/09/2014 Elsevier Interactive Patient Education  [employer].

## 2017-06-09 LAB — SPECIMEN STATUS REPORT

## 2017-06-11 LAB — PARATHYROID HORMONE, INTACT (NO CA): PTH: 43 pg/mL (ref 15–65)

## 2017-06-12 LAB — CALCIUM, URINE, 24 HOUR: CALCIUM 24H UR: 197 mg/(24.h)

## 2017-06-12 LAB — BASIC METABOLIC PANEL WITH GFR
BUN/Creatinine Ratio: 17 (calc) (ref 6–22)
BUN: 19 mg/dL (ref 7–25)
CALCIUM: 10.5 mg/dL — AB (ref 8.6–10.4)
CHLORIDE: 105 mmol/L (ref 98–110)
CO2: 26 mmol/L (ref 20–32)
Creat: 1.11 mg/dL — ABNORMAL HIGH (ref 0.50–0.99)
GFR, Est African American: 59 mL/min/{1.73_m2} — ABNORMAL LOW (ref >=60)
GFR, Est Non African American: 51 mL/min/{1.73_m2} — ABNORMAL LOW (ref >=60)
GLUCOSE: 85 mg/dL (ref 65–99)
POTASSIUM: 3.7 mmol/L (ref 3.5–5.3)
SODIUM: 139 mmol/L (ref 135–146)

## 2017-06-12 LAB — CREATININE, URINE, 24 HOUR: Creatinine, 24H Ur: 1.38 g/(24.h) (ref 0.50–2.15)

## 2017-06-12 LAB — VITAMIN D 1,25 DIHYDROXY
VITAMIN D 1, 25 (OH) TOTAL: 50 pg/mL (ref 18–72)
Vitamin D2 1, 25 (OH)2: 29 pg/mL
Vitamin D3 1, 25 (OH)2: 21 pg/mL

## 2017-08-11 ENCOUNTER — Other Ambulatory Visit: Payer: Self-pay | Admitting: Neurology

## 2017-09-10 ENCOUNTER — Ambulatory Visit: Payer: Medicare Other | Admitting: Podiatry

## 2017-09-10 DIAGNOSIS — M79675 Pain in left toe(s): Secondary | ICD-10-CM | POA: Diagnosis not present

## 2017-09-10 DIAGNOSIS — B351 Tinea unguium: Secondary | ICD-10-CM | POA: Diagnosis not present

## 2017-09-10 DIAGNOSIS — M79674 Pain in right toe(s): Secondary | ICD-10-CM

## 2017-09-10 NOTE — Progress Notes (Signed)
Subjective: 72 y.o. returns the office today for painful, elongated, thickened toenails which she cannot trim herself. Denies any redness or drainage around the nails. Denies any acute changes since last appointment and no new complaints today. Denies any systemic complaints such as fevers, chills, nausea, vomiting.   PCP: Lanice Schwab, MD  Objective: AAO 3, NAD DP/PT pulses palpable, CRT less than 3 seconds Nails hypertrophic, dystrophic, elongated, brittle, discolored 10. There is tenderness overlying the nails 1-5 bilaterally. There is no surrounding erythema or drainage along the nail sites. No open lesions or pre-ulcerative lesions are identified. No other areas of tenderness bilateral lower extremities. No overlying edema, erythema, increased warmth. No pain with calf compression, swelling, warmth, erythema. No new concerns  Assessment: Patient presents with symptomatic onychomycosis  Plan: -Treatment options including alternatives, risks, complications were discussed -Nails sharply debrided 10 without complication/bleeding. -Lateral hallux nails appear to be the worst we discussed nail avulsion.  She will think about this. -Discussed daily foot inspection. If there are any changes, to call the office immediately.  -Follow-up in 3 months or sooner if any problems are to arise. In the meantime, encouraged to call the office with any questions, concerns, changes symptoms.  Ovid Curd, DPM

## 2017-11-10 ENCOUNTER — Other Ambulatory Visit: Payer: Self-pay | Admitting: Neurology

## 2017-12-14 ENCOUNTER — Ambulatory Visit: Payer: Medicare Other | Admitting: Podiatry

## 2017-12-17 ENCOUNTER — Encounter: Payer: Self-pay | Admitting: Podiatry

## 2017-12-17 ENCOUNTER — Ambulatory Visit: Payer: Medicare Other | Admitting: Podiatry

## 2017-12-17 DIAGNOSIS — B351 Tinea unguium: Secondary | ICD-10-CM | POA: Diagnosis not present

## 2017-12-17 DIAGNOSIS — M79674 Pain in right toe(s): Secondary | ICD-10-CM | POA: Diagnosis not present

## 2017-12-17 DIAGNOSIS — M79675 Pain in left toe(s): Secondary | ICD-10-CM

## 2017-12-18 NOTE — Progress Notes (Signed)
Subjective: 72 y.o. returns the office today for painful, elongated, thickened toenails which she cannot trim herself. Denies any redness or drainage around the nails. Denies any acute changes since last appointment and no new complaints today. Denies any systemic complaints such as fevers, chills, nausea, vomiting.   PCP: Lanice SchwabNymberg, Jerome H, MD  Objective: AAO 3, NAD DP/PT pulses palpable, CRT less than 3 seconds Nails hypertrophic, dystrophic, elongated, brittle, discolored 10. There is tenderness overlying the nails 1-5 bilaterally. There is no surrounding erythema or drainage along the nail sites. No open lesions or pre-ulcerative lesions are identified. No pain with calf compression, swelling, warmth, erythema. No new concerns  Assessment: Patient presents with symptomatic onychomycosis  Plan: -Treatment options including alternatives, risks, complications were discussed -Nails sharply debrided 10 without complication/bleeding. -Discussed daily foot inspection. If there are any changes, to call the office immediately.  -Follow-up in 3 months or sooner if any problems are to arise. In the meantime, encouraged to call the office with any questions, concerns, changes symptoms.  Ovid CurdMatthew Wagoner, DPM

## 2017-12-28 ENCOUNTER — Ambulatory Visit: Payer: Medicare Other | Admitting: Internal Medicine

## 2017-12-28 ENCOUNTER — Encounter: Payer: Self-pay | Admitting: Internal Medicine

## 2017-12-28 DIAGNOSIS — Z8639 Personal history of other endocrine, nutritional and metabolic disease: Secondary | ICD-10-CM | POA: Insufficient documentation

## 2017-12-28 LAB — VITAMIN D 25 HYDROXY (VIT D DEFICIENCY, FRACTURES): VITD: 54.73 ng/mL (ref 30.00–100.00)

## 2017-12-28 NOTE — Patient Instructions (Addendum)
Please stop at the lab.  Please come back for a follow-up appointment in 1 year.  

## 2017-12-28 NOTE — Progress Notes (Signed)
Patient ID: [name], female   DOB: [birth year], 72 y.o.   MRN: [id number]    HPI  Heather Brady is a 72 y.o.-year-old female, initially referred by her PCP, Nymberg, Dairl Ponder, MD, returning for follow-up for hypercalcemia/primary hyperparathyroidism and history of vitamin D deficiency.  Last visit 6 months ago.  She is here with her daughter who offers part of the history, especially related to her medical history and symptoms.  She has had hypercalcemia at least since 2017.  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  She is seen by neurology at [facility].  Her condition is getting worse.  Her balance is worse. Stopped Sinemet b/c not helping.  No known treatment available.  She was evaluated for her swallowing and this is still excellent.  At last visit, we establish the diagnosis of primary hyperparathyroidism on the basis of an elevated calcium with a nonsuppressed PTH.  However, due to above condition, she and her family opted to wait until next visit to recheck her tests before making the decision about possible surgery.  I reviewed her pertinent labs: Lab Results  Component Value Date   PTH 43 06/08/2017   CALCIUM 10.5 (H) 06/08/2017  01/25/2017: Calcium 11.3, ionized calcium 5.9, intact PTH 43, PTH related peptide <2.0, vitamin D 25-hydroxy 48, BUN/creatinine  14/0.82; SPEP; polyclonal hypergammaglobulinemia; serum kappa/lambda light chain ratio normal ([facility] was consulted and they recommended repeat SPEP in 6 months) 11/30/2016: Calcium 10.4, ionized calcium 5.7, TSH 0.719, alkaline phosphatase 80 04/27/2016: Calcium 10.5, alkaline phosphatase 78 01/27/2016: Calcium 10.9 11/29/2015: Calcium 10.4  At last visit, her magnesium, phosphorus, and calcitriol levels were normal: Component     Latest Ref Rng & Units 06/08/2017  Vitamin D 1, 25 (OH) Total     18 - 72 pg/mL 50  Vitamin D3 1, 25 (OH)     pg/mL 21  Vitamin D2 1, 25  (OH)     pg/mL 29  Phosphorus     2.3 - 4.6 mg/dL 2.7  Magnesium     1.5 - 2.5 mg/dL 1.7   Also, her 40-NUUV urine calcium level was normal: Component     Latest Ref Rng & Units 06/11/2017  Calcium, 24H Urine     mg/24 h 197  Creatinine, 24H Ur     0.50 - 2.15 g/24 h 1.38   Reviewed her most recent DXA scan report: Osteopenia: Date L1-L4 T score FN T score FRAX  08/2016 ([facility]) +0.3 RFN: -1.8 RFN: -1.8 MOF: 10.5% 10-year risk of hip fracture: 1.7%   No fractures. She fell and dislocated her shoulder approximately 5 years ago.  She has a history of kidney stones in 1969.  + CKD stage III. Last BUN/Cr: Lab Results  Component Value Date   BUN 19 06/08/2017   CREATININE 1.11 (H) 06/08/2017  01/25/2017: BUN/creatinine  14/0.82  She is not on HCTZ.  She has a history of vitamin D deficiency.   Reviewed her most recent vitamin D levels: Lab Results  Component Value Date   VD25OH 50.06 06/08/2017   01/25/2017: Vitamin D 48  She takes ergocalciferol 50,000 units every 2 weeks.  Pt does not have a FH of hypercalcemia, pituitary tumors, thyroid cancer, or osteoporosis.   She also has a history of uncontrolled hypothyroidism.  ROS: Constitutional: no weight gain/no weight loss, no fatigue, no subjective hyperthermia, no subjective hypothermia, + nocturia Eyes: no blurry vision, no  xerophthalmia ENT: no sore throat, no nodules palpated in throat, no dysphagia, no odynophagia, no hoarseness Cardiovascular: no CP/no SOB/no palpitations/+ leg swelling Respiratory: no cough/no SOB/no wheezing Gastrointestinal: no N/no V/no D/no C/no acid reflux Musculoskeletal: + Muscle aches/+ joint aches Skin: no rashes, no hair loss Neurological: no tremors/no numbness/no tingling/no dizziness, + increased disequilibrium  I reviewed pt's medications, allergies, PMH, social hx, family hx, and changes were documented in the history of present illness. Otherwise, unchanged from my  initial visit note.  Past Medical History:  Diagnosis Date  . Fibromyalgia   . GERD (gastroesophageal reflux disease)   . Hypertension   . Kidney stones 1982   Past Surgical History:  Procedure Laterality Date  . carpel tunnel  2000   right  . NOSE SURGERY  1998   reconstruction  . OVARIAN CYST SURGERY  2011  . ROTATOR CUFF REPAIR Right    Social History   Socioeconomic History  . Marital status: Single    Spouse name: Not on file  . Number of children: 2  . Years of education: 36  Social Needs  Occupational History  . Occupation: retired    Comment: Product/process development scientist  Tobacco Use  . Smoking status: Never Smoker  . Smokeless tobacco: Never Used  Substance and Sexual Activity  . Alcohol use: No  . Drug use: No  Social History Narrative   Lives at home alone   Drinks 16 oz tea daily   Current Outpatient Medications on File Prior to Visit  Medication Sig Dispense Refill  . amLODipine (NORVASC) 10 MG tablet Take 5 mg by mouth daily.     . Carbidopa-Levodopa ER (SINEMET CR) 25-100 MG tablet controlled release TAKE 2 TABLETS BY MOUTH 3 TIMES A DAY 180 tablet 0  . esomeprazole (NEXIUM) 40 MG capsule Take 40 mg by mouth daily at 12 noon.    Marland Kitchen HYDROcodone-acetaminophen (NORCO/VICODIN) 5-325 MG tablet Take 1 tablet by mouth every 6 (six) hours as needed for moderate pain. 15 tablet 0  . ibuprofen (ADVIL,MOTRIN) 800 MG tablet Take 800 mg by mouth 3 (three) times daily as needed.  1  . levothyroxine (SYNTHROID, LEVOTHROID) 75 MCG tablet     . losartan (COZAAR) 50 MG tablet     . Melatonin 10 MG CAPS Take 15 mg by mouth.    . Multiple Vitamin (MULTIVITAMIN) LIQD Take 5 mLs by mouth daily.    . naproxen sodium (ANAPROX) 220 MG tablet Take 220 mg by mouth as needed.    . Vitamin D, Ergocalciferol, (DRISDOL) 50000 UNITS CAPS capsule TAKE ONE CAPSULE BY MOUTH EVERY 2 WEEKS  5   No current facility-administered medications on file prior to visit.    Allergies  Allergen  Reactions  . Amoxicillin Itching  . Carbidopa-Levodopa Itching   Family History  Problem Relation Age of Onset  . Healthy Mother   . Healthy Father     PE: There were no vitals taken for this visit. Wt Readings from Last 3 Encounters:  06/08/17 169 lb 6.4 oz (76.8 kg)  11/02/16 182 lb (82.6 kg)  07/21/16 184 lb (83.5 kg)   Constitutional: overweight, in NAD.  In wheelchair. Eyes: PERRLA, EOMI, no exophthalmos ENT: moist mucous membranes, no thyromegaly, no cervical lymphadenopathy Cardiovascular: RRR, No MRG Respiratory: CTA B Gastrointestinal: abdomen soft, NT, ND, BS+ Musculoskeletal: no deformities, strength intact in all 4 Skin: moist, warm, no rashes Neurological: no tremor with outstretched hands, DTR not checked  Assessment: 1.  Primary hyperparathyroidism  2.  History of vitamin D deficiency  Plan: Patient with history of elevated calcium, with the highest level being at 11.3.  A corresponding intact PTH level was not suppressed, at 45 at that time, and 43 at last visit.  She does have a history of vitamin D deficiency, however, at last check, she was not vitamin D deficient. -She has no apparent complications from her hypercalcemia.  She has a distant history of nephrolithiasis, no osteoporosis, but osteopenia, no fractures.  She denies abdominal pain, depression, bone pain.  She has constipation, though. -She had previous investigation with a normal PTH RP and SPEP and, at last visit, we investigated her for primary hyperparathyroidism and the investigation was positive.  Although a magnesium, phosphorus, calcitriol, 25-hydroxy vitamin D and 24-hour urine urine calcium were normal, her calcium was elevated and her PTH was not suppressed.  Especially in the presence of osteopenia, I would still suggest an evaluation by surgery, however, due to her neurologic problems, patient and her family wanted to have her levels checked again in 6 months, at this visit before a  treatment decision was made. -At this visit, we reviewed the labs from last visit and I explained that they were all normal with the exception of a slightly elevated calcium in the presence of a nonsuppressed PTH.  Because the condition is mild, per their preference, I would not refer her to surgery, but will recheck her levels today and then again in a year. -Today will recheck her calcium, intact PTH by the [facility] assay, and the 25 hydroxy vitamin D. -Criteria for surgery are: Increased calcium by more than 1 mg/dL above the upper limit of normal  Kidney ds.  Osteoporosis (or Vb fx) Age <70 years old Newer criteria (2013): High UCa >400 mg/d and increased stone risk by biochemical stone risk analysis Presence of nephrolithiasis or nephrocalcinosis Pt's preference!  - I will see the patient back in 1 year  2.  History of vitamin D deficiency -Reviewed most recent vitamin D level from last visit and this was normal -She continues on ergocalciferol 50,000 units every 2 weeks. -We will recheck today  Component     Latest Ref Rng & Units 12/28/2017  Calcium     8.7 - 10.3 mg/dL 82.910.6 (H)  PTH, Intact     15 - 65 pg/mL 34  PTH Interp      Comment  VITD     30.00 - 100.00 ng/mL 54.73  Labs mildly abnormal.  We can continue to keep an eye on these for now.  Carlus Pavlovristina Royalty Domagala, MD PhD [facility]

## 2017-12-29 LAB — PTH, INTACT AND CALCIUM
CALCIUM: 10.6 mg/dL — AB (ref 8.7–10.3)
PTH: 34 pg/mL (ref 15–65)

## 2018-01-14 ENCOUNTER — Telehealth: Payer: Self-pay | Admitting: Neurology

## 2018-01-14 NOTE — Telephone Encounter (Signed)
Patient's daughter made aware she needs a follow up appt with [place]. Insurance will not cover home health therapies if we have not seen patient within 6 months. Aware to make an appt, but this could be ordered by PCP if they want to discuss with them prior to being seen in our office. Daughter expressed understanding.

## 2018-01-14 NOTE — Telephone Encounter (Signed)
Heather Brady called regarding her mother and having her start therapy/ OT? She is needing her to have Home Health Care. The only way insurance will pay is if she has more therapy. She had Therapy last year. Please Call. Thanks

## 2018-01-26 ENCOUNTER — Emergency Department (HOSPITAL_COMMUNITY)
Admission: EM | Admit: 2018-01-26 | Discharge: 2018-01-27 | Disposition: A | Discharge status: Home / Self Care | Payer: Medicare Other | Attending: Emergency Medicine | Admitting: Emergency Medicine

## 2018-01-26 ENCOUNTER — Other Ambulatory Visit: Payer: Self-pay

## 2018-01-26 ENCOUNTER — Emergency Department (HOSPITAL_COMMUNITY): Payer: Medicare Other

## 2018-01-26 ENCOUNTER — Encounter (HOSPITAL_COMMUNITY): Payer: Self-pay | Admitting: Emergency Medicine

## 2018-01-26 DIAGNOSIS — Z79899 Other long term (current) drug therapy: Secondary | ICD-10-CM | POA: Diagnosis not present

## 2018-01-26 DIAGNOSIS — Y929 Unspecified place or not applicable: Secondary | ICD-10-CM | POA: Diagnosis not present

## 2018-01-26 DIAGNOSIS — Y999 Unspecified external cause status: Secondary | ICD-10-CM | POA: Insufficient documentation

## 2018-01-26 DIAGNOSIS — W19XXXA Unspecified fall, initial encounter: Secondary | ICD-10-CM

## 2018-01-26 DIAGNOSIS — W010XXA Fall on same level from slipping, tripping and stumbling without subsequent striking against object, initial encounter: Secondary | ICD-10-CM | POA: Insufficient documentation

## 2018-01-26 DIAGNOSIS — S50311A Abrasion of right elbow, initial encounter: Secondary | ICD-10-CM | POA: Insufficient documentation

## 2018-01-26 DIAGNOSIS — S0990XA Unspecified injury of head, initial encounter: Secondary | ICD-10-CM | POA: Diagnosis present

## 2018-01-26 DIAGNOSIS — S0101XA Laceration without foreign body of scalp, initial encounter: Secondary | ICD-10-CM

## 2018-01-26 DIAGNOSIS — I10 Essential (primary) hypertension: Secondary | ICD-10-CM | POA: Insufficient documentation

## 2018-01-26 DIAGNOSIS — Y939 Activity, unspecified: Secondary | ICD-10-CM | POA: Insufficient documentation

## 2018-01-26 LAB — CBC WITH DIFFERENTIAL/PLATELET
Basophils Absolute: 0.1 10*3/uL (ref 0.0–0.1)
Basophils Relative: 1 %
Eosinophils Absolute: 0.2 10*3/uL (ref 0.0–0.7)
Eosinophils Relative: 2 %
HEMATOCRIT: 29.2 % — AB (ref 36.0–46.0)
Hemoglobin: 9 g/dL — ABNORMAL LOW (ref 12.0–15.0)
LYMPHS PCT: 19 %
Lymphs Abs: 1.7 10*3/uL (ref 0.7–4.0)
MCH: 24.9 pg — ABNORMAL LOW (ref 26.0–34.0)
MCHC: 30.8 g/dL (ref 30.0–36.0)
MCV: 80.7 fL (ref 78.0–100.0)
MONO ABS: 0.6 10*3/uL (ref 0.1–1.0)
MONOS PCT: 7 %
NEUTROS ABS: 6.4 10*3/uL (ref 1.7–7.7)
Neutrophils Relative %: 71 %
PLATELETS: 274 10*3/uL (ref 150–400)
RBC: 3.62 MIL/uL — ABNORMAL LOW (ref 3.87–5.11)
RDW: 15.1 % (ref 11.5–15.5)
WBC: 8.9 10*3/uL (ref 4.0–10.5)

## 2018-01-26 LAB — BASIC METABOLIC PANEL
ANION GAP: 13 (ref 5–15)
BUN: 16 mg/dL (ref 8–23)
CALCIUM: 10.9 mg/dL — AB (ref 8.9–10.3)
CO2: 25 mmol/L (ref 22–32)
Chloride: 105 mmol/L (ref 98–111)
Creatinine, Ser: 0.94 mg/dL (ref 0.44–1.00)
Glucose, Bld: 128 mg/dL — ABNORMAL HIGH (ref 70–99)
Potassium: 3.1 mmol/L — ABNORMAL LOW (ref 3.5–5.1)
SODIUM: 143 mmol/L (ref 135–145)

## 2018-01-26 NOTE — ED Notes (Signed)
Patient transported to X-ray 

## 2018-01-26 NOTE — ED Triage Notes (Addendum)
Pt arriving from home with daughter for fall. Daughter reports pt has frequent falls at home. Pt has laceration on right side of head, bruising over right ear, and large knot on right elbow. Pt using right arm but expressing discomfort during use.

## 2018-01-27 MED ORDER — LIDOCAINE-EPINEPHRINE-TETRACAINE (LET) SOLUTION
3.0000 mL | Freq: Once | NASAL | Status: AC
  Administered 2018-01-27: 3 mL via TOPICAL
  Filled 2018-01-27: qty 3

## 2018-01-27 MED ORDER — ACETAMINOPHEN 500 MG PO TABS
1000.0000 mg | ORAL_TABLET | Freq: Once | ORAL | Status: AC
  Administered 2018-01-27: 1000 mg via ORAL
  Filled 2018-01-27: qty 2

## 2018-01-27 MED ORDER — BACITRACIN ZINC 500 UNIT/GM EX OINT
TOPICAL_OINTMENT | Freq: Every day | CUTANEOUS | Status: DC
  Administered 2018-01-27: 1 via TOPICAL
  Filled 2018-01-27: qty 0.9

## 2018-01-27 NOTE — ED Provider Notes (Signed)
[facility]-EMERGENCY DEPT Provider Note   CSN: [id number] Arrival date & time: 01/26/18  2210     History   Chief Complaint Chief Complaint  Patient presents with  . Fall    HPI Heather Brady is a 72 y.o. female.  The history is provided by the patient.  Fall  This is a new problem. The current episode started less than 1 hour ago. The problem occurs constantly. The problem has not changed since onset.Pertinent negatives include no chest pain, no abdominal pain and no shortness of breath. Nothing aggravates the symptoms. Nothing relieves the symptoms. She has tried nothing for the symptoms. The treatment provided no relief.  Dan HumphreysWalker was not the right spot and patient fell as could not grab hold of it.    Past Medical History:  Diagnosis Date  . Fibromyalgia   . GERD (gastroesophageal reflux disease)   . Hypertension   . Kidney stones 1982    Patient Active Problem List   Diagnosis Date Noted  . H/O vitamin D deficiency 12/28/2017  . Hypercalcemia 06/08/2017  . Gait difficulty 10/20/2014  . Parkinsonism (HCC) 10/20/2014  . Dizziness and giddiness 10/20/2014    Past Surgical History:  Procedure Laterality Date  . carpel tunnel  2000   right  . NOSE SURGERY  1998   reconstruction  . OVARIAN CYST SURGERY  2011  . ROTATOR CUFF REPAIR Right      OB History   None      Home Medications    Prior to Admission medications   Medication Sig Start Date End Date Taking? Authorizing Provider  amLODipine (NORVASC) 10 MG tablet Take 5 mg by mouth daily.     [provider]  atorvastatin (LIPITOR) 20 MG tablet Take 20 mg by mouth daily. 11/22/17   [provider]  esomeprazole (NEXIUM) 40 MG capsule Take 40 mg by mouth daily at 12 noon.    [provider]  HYDROcodone-acetaminophen (NORCO/VICODIN) 5-325 MG tablet Take 1 tablet by mouth every 6 (six) hours as needed for moderate pain. 05/12/15   Shade FloodGreene, Jeffrey R, MD    ibuprofen (ADVIL,MOTRIN) 800 MG tablet Take 800 mg by mouth 3 (three) times daily as needed. 07/29/15   [provider]  levothyroxine (SYNTHROID, LEVOTHROID) 75 MCG tablet  10/06/14   [provider]  losartan (COZAAR) 50 MG tablet  10/06/14   [provider]  Multiple Vitamin (MULTIVITAMIN) LIQD Take 5 mLs by mouth daily.    [provider]  naproxen sodium (ANAPROX) 220 MG tablet Take 220 mg by mouth as needed.    [provider]  Vitamin D, Ergocalciferol, (DRISDOL) 50000 UNITS CAPS capsule TAKE ONE CAPSULE BY MOUTH EVERY 2 WEEKS 08/03/14   [provider]    Family History Family History  Problem Relation Age of Onset  . Healthy Mother   . Healthy Father     Social History Social History   Tobacco Use  . Smoking status: Never Smoker  . Smokeless tobacco: Never Used  Substance Use Topics  . Alcohol use: No  . Drug use: No     Allergies   Amoxicillin and Carbidopa-levodopa   Review of Systems Review of Systems  Respiratory: Negative for shortness of breath.   Cardiovascular: Negative for chest pain.  Gastrointestinal: Negative for abdominal pain.  Skin: Positive for wound.  Neurological: Negative for seizures, weakness and numbness.  All other systems reviewed and are negative.    Physical Exam Updated  Vital Signs BP (!) 195/91   Pulse 95   Temp 98.6 F (37 C) (Oral)   Resp 16   SpO2 99%   Physical Exam  Constitutional: She is oriented to person, place, and time. She appears well-developed and well-nourished. No distress.  HENT:  Head: Normocephalic.    Right Ear: External ear normal.  Left Ear: External ear normal.  Nose: Nose normal.  Mouth/Throat: Oropharynx is clear and moist. No oropharyngeal exudate.  Eyes: Pupils are equal, round, and reactive to light.  Neck: Normal range of motion. Neck supple.  Cardiovascular: Normal rate, regular rhythm, normal heart sounds and intact distal pulses.   Pulmonary/Chest: Effort normal and breath sounds normal. No stridor. She has no wheezes. She has no rales.  Abdominal: Soft. Bowel sounds are normal. There is no tenderness.  Musculoskeletal: Normal range of motion.       Arms: Neurological: She is alert and oriented to person, place, and time.  Skin: Skin is warm and dry. Capillary refill takes less than 2 seconds.     ED Treatments / Results  Labs (all labs ordered are listed, but only abnormal results are displayed) Labs Reviewed  CBC WITH DIFFERENTIAL/PLATELET - Abnormal; Notable for the following components:      Result Value   RBC 3.62 (*)    Hemoglobin 9.0 (*)    HCT 29.2 (*)    MCH 24.9 (*)    All other components within normal limits  BASIC METABOLIC PANEL - Abnormal; Notable for the following components:   Potassium 3.1 (*)    Glucose, Bld 128 (*)    Calcium 10.9 (*)    All other components within normal limits    EKG None  Radiology Dg Elbow Complete Right  Result Date: 01/26/2018 CLINICAL DATA:  fall. EXAM: RIGHT ELBOW - COMPLETE 3+ VIEW COMPARISON:  None. FINDINGS: No acute bony abnormality. Specifically, no fracture, subluxation, or dislocation. No joint effusion. IMPRESSION: No acute bony abnormality. Electronically Signed   By: Charlett Nose M.D.   On: 01/26/2018 23:14   Ct Head Wo Contrast  Result Date: 01/27/2018 CLINICAL DATA:  72 year old female with fall. EXAM: CT HEAD WITHOUT CONTRAST CT CERVICAL SPINE WITHOUT CONTRAST TECHNIQUE: Multidetector CT imaging of the head and cervical spine was performed following the standard protocol without intravenous contrast. Multiplanar CT image reconstructions of the cervical spine were also generated. COMPARISON:  Cervical spine MRI dated 11/03/2014 FINDINGS: CT HEAD FINDINGS Brain: The ventricles and sulci appropriate size for patient's age. Mild periventricular and deep white matter chronic microvascular ischemic changes noted. There is no acute intracranial  hemorrhage. No mass effect or midline shift noted. No extra-axial fluid collection. Vascular: No hyperdense vessel or unexpected calcification. Skull: Normal. Negative for fracture or focal lesion. Sinuses/Orbits: Mild mucoperiosteal thickening of paranasal sinuses. No air-fluid levels. Postsurgical changes of maxillary sinuses. The mastoid air cells are clear. Other: None CT CERVICAL SPINE FINDINGS Alignment: No acute subluxation.  Grade 1 C7-T1 anterolisthesis. Skull base and vertebrae: No acute fracture. No primary bone lesion or focal pathologic process. Soft tissues and spinal canal: No prevertebral fluid or swelling. No visible canal hematoma. Disc levels:  Multilevel degenerative changes and facet hypertrophy. Upper chest: Negative. Other: A 13 x 11 mm right thyroid hypodense nodule. Ultrasound may provide better evaluation on the nonemergent basis. IMPRESSION: 1. No acute intracranial hemorrhage. 2. No acute/traumatic cervical spine pathology. Electronically Signed   By: Elgie Collard M.D.   On: 01/27/2018 00:20   Dg Shoulder Left  Result Date: 01/26/2018 CLINICAL DATA:  Fall EXAM: LEFT SHOULDER - 2+ VIEW COMPARISON:  None. FINDINGS: Degenerative changes in the [facility] joint with joint space narrowing and spurring. Glenohumeral joint is maintained. No acute bony abnormality. Specifically, no fracture, subluxation, or dislocation. Soft tissues are intact. IMPRESSION: Degenerative changes in the left AC joint. No acute bony abnormality. Electronically Signed   By: Charlett Nose M.D.   On: 01/26/2018 23:15    Procedures .Marland KitchenLaceration Repair Date/Time: 01/27/2018 1:58 AM Performed by: Cy Blamer, MD Authorized by: Cy Blamer, MD   Consent:    Consent obtained:  Verbal   Consent given by:  Patient   Risks discussed:  Infection, need for additional repair, nerve damage, poor wound healing, poor cosmetic result, pain, retained foreign body, tendon damage and vascular damage   Alternatives  discussed:  No treatment Anesthesia (see MAR for exact dosages):    Anesthesia method:  Topical application   Topical anesthetic:  LET Laceration details:    Location:  Scalp   Scalp location:  R temporal   Length (cm):  1   Depth (mm):  1 Repair type:    Repair type:  Simple Exploration:    Hemostasis achieved with:  Direct pressure   Wound exploration: wound explored through full range of motion     Contaminated: no   Treatment:    Area cleansed with:  Betadine   Amount of cleaning:  Standard Skin repair:    Repair method:  Staples   Number of staples:  3 Approximation:    Approximation:  Close Post-procedure details:    Dressing:  Sterile dressing   Patient tolerance of procedure:  Tolerated well, no immediate complications   (including critical care time)  Medications Ordered in ED Medications  bacitracin ointment (1 application Topical Given 01/27/18 0055)  acetaminophen (TYLENOL) tablet 1,000 mg (1,000 mg Oral Given 01/27/18 0055)  lidocaine-EPINEPHrine-tetracaine (LET) solution (3 mLs Topical Given 01/27/18 0055)       Final Clinical Impressions(s) / ED Diagnoses   Staple removal in 7 days at urgent care.     Return for fevers >100.4 unrelieved by medication, shortness of breath, intractable vomiting, or diarrhea, Inability to tolerate liquids or food, cough, altered mental status or any concerns. No signs of systemic illness or infection. The patient is nontoxic-appearing on exam and vital signs are within normal limits.   I have reviewed the triage vital signs and the nursing notes. Pertinent labs &imaging results that were available during my care of the patient were reviewed by me and considered in my medical decision making (see chart for details).  After history, exam, and medical workup I feel the patient has been appropriately medically screened and is safe for discharge home. Pertinent diagnoses were discussed with the patient. Patient was given  return precautions.   Mitsue Peery, MD 01/27/18 [id number]

## 2018-02-03 ENCOUNTER — Ambulatory Visit (HOSPITAL_COMMUNITY)
Admission: EM | Admit: 2018-02-03 | Discharge: 2018-02-03 | Disposition: A | Discharge status: Home / Self Care | Payer: Medicare Other

## 2018-02-03 DIAGNOSIS — Z4802 Encounter for removal of sutures: Secondary | ICD-10-CM

## 2018-02-03 DIAGNOSIS — S0102XA Laceration with foreign body of scalp, initial encounter: Secondary | ICD-10-CM

## 2018-02-03 NOTE — ED Notes (Signed)
Pt here for 3 staples removed from head. Wound well healed. Staples removed, no bleeding. Pt had no further questions.

## 2018-02-07 ENCOUNTER — Other Ambulatory Visit: Payer: Self-pay

## 2018-02-07 DIAGNOSIS — R42 Dizziness and giddiness: Secondary | ICD-10-CM

## 2018-02-07 NOTE — Progress Notes (Signed)
Placed order in for patient per Dr. Marinus Maw office fax request.

## 2018-02-07 NOTE — Progress Notes (Signed)
Put in order for echocardiogramper fax from Dr. Marinus Maw office

## 2018-02-22 ENCOUNTER — Encounter: Payer: Self-pay | Admitting: Family Medicine

## 2018-03-01 ENCOUNTER — Ambulatory Visit (INDEPENDENT_AMBULATORY_CARE_PROVIDER_SITE_OTHER): Payer: Medicare Other

## 2018-03-01 DIAGNOSIS — R42 Dizziness and giddiness: Secondary | ICD-10-CM

## 2018-03-06 ENCOUNTER — Telehealth: Payer: Self-pay

## 2018-03-06 NOTE — Telephone Encounter (Signed)
error 

## 2018-03-08 ENCOUNTER — Ambulatory Visit: Payer: Medicare Other | Admitting: Podiatry

## 2018-03-08 DIAGNOSIS — M79675 Pain in left toe(s): Secondary | ICD-10-CM | POA: Diagnosis not present

## 2018-03-08 DIAGNOSIS — B351 Tinea unguium: Secondary | ICD-10-CM

## 2018-03-08 DIAGNOSIS — M79674 Pain in right toe(s): Secondary | ICD-10-CM | POA: Diagnosis not present

## 2018-03-08 NOTE — Patient Instructions (Signed)

## 2018-03-21 ENCOUNTER — Ambulatory Visit: Payer: Medicare Other | Admitting: Podiatry

## 2018-03-27 ENCOUNTER — Encounter: Payer: Self-pay | Admitting: Podiatry

## 2018-03-27 NOTE — Progress Notes (Signed)
Subjective: Heather Brady presents today with  cc of painful, discolored, thick toenails which interfere with daily activities and routine tasks.  Pain is aggravated when wearing enclosed shoe gear. Pain is getting progressively worse and relieved with periodic professional debridement.  Heather Brady voices no new problems on today's visit.  Past Medical History:  Diagnosis Date  . Fibromyalgia   . GERD (gastroesophageal reflux disease)   . Hypertension   . Kidney stones 1982   Current Outpatient Medications:  .  amLODipine (NORVASC) 10 MG tablet, Take 5 mg by mouth daily. , Disp: , Rfl:  .  atorvastatin (LIPITOR) 20 MG tablet, Take 20 mg by mouth daily., Disp: , Rfl: 5 .  esomeprazole (NEXIUM) 40 MG capsule, Take 40 mg by mouth daily at 12 noon., Disp: , Rfl:  .  HYDROcodone-acetaminophen (NORCO/VICODIN) 5-325 MG tablet, Take 1 tablet by mouth every 6 (six) hours as needed for moderate pain., Disp: 15 tablet, Rfl: 0 .  ibuprofen (ADVIL,MOTRIN) 800 MG tablet, Take 800 mg by mouth 3 (three) times daily as needed., Disp: , Rfl: 1 .  levothyroxine (SYNTHROID, LEVOTHROID) 75 MCG tablet, , Disp: , Rfl:  .  losartan (COZAAR) 50 MG tablet, , Disp: , Rfl:  .  Multiple Vitamin (MULTIVITAMIN) LIQD, Take 5 mLs by mouth daily., Disp: , Rfl:  .  naproxen sodium (ANAPROX) 220 MG tablet, Take 220 mg by mouth as needed., Disp: , Rfl:  .  Vitamin D, Ergocalciferol, (DRISDOL) 50000 UNITS CAPS capsule, TAKE ONE CAPSULE BY MOUTH EVERY 2 WEEKS, Disp: , Rfl: 5   Allergies  Allergen Reactions  . Amoxicillin Itching  . Carbidopa-Levodopa Itching   Social History   Tobacco Use  Smoking Status Never Smoker  Smokeless Tobacco Never Used   Objective: Vascular Examination: Capillary refill time <3 seconds x 10 digits Dorsalis pedis and posterior tibial pulses present b/l No digital hair x 10 digits Skin temperature warm to warm b/l  Dermatological Examination: Skin with normal turgor, texture and tone  b/l  Toenails 1-5 b/l discolored, thick, dystrophic with subungual debris and pain with palpation to nailbeds due to thickness of nails.  Musculoskeletal: Muscle strength 5/5 to all LE muscle groups  Neurological: Sensation intact with 10 gram monofilament. Vibratory sensation intact.  Assessment: Painful onychomycosis toenails 1-5 b/l   Plan: 1. Toenails 1-5 b/l were debrided in length and girth without iatrogenic bleeding. 2. Patient to continue soft, supportive shoe gear 3. Patient to report any pedal injuries to medical professional immediately. 4. Follow up 10 weeks. Patient/POA to call should there be a concern in the interim.

## 2018-03-29 ENCOUNTER — Other Ambulatory Visit (HOSPITAL_COMMUNITY): Payer: Self-pay | Admitting: Family Medicine

## 2018-03-29 DIAGNOSIS — R42 Dizziness and giddiness: Secondary | ICD-10-CM

## 2018-04-03 ENCOUNTER — Other Ambulatory Visit (HOSPITAL_COMMUNITY): Payer: Medicare Other

## 2018-04-08 ENCOUNTER — Encounter: Payer: Self-pay | Admitting: Family Medicine

## 2018-04-15 ENCOUNTER — Telehealth: Payer: Self-pay

## 2018-04-15 NOTE — Telephone Encounter (Signed)
New message    Just an FYI. We have made several attempts to contact this patient including sending a letter to schedule or reschedule their echocardiogram. We will be removing the patient from the echo WQ.   Thank you 

## 2018-05-17 ENCOUNTER — Ambulatory Visit: Payer: Medicare Other | Admitting: Podiatry

## 2018-05-17 DIAGNOSIS — M79674 Pain in right toe(s): Secondary | ICD-10-CM | POA: Diagnosis not present

## 2018-05-17 DIAGNOSIS — M79675 Pain in left toe(s): Secondary | ICD-10-CM

## 2018-05-17 DIAGNOSIS — B351 Tinea unguium: Secondary | ICD-10-CM | POA: Diagnosis not present

## 2018-05-17 NOTE — Patient Instructions (Signed)

## 2018-06-08 ENCOUNTER — Encounter: Payer: Self-pay | Admitting: Podiatry

## 2018-06-08 NOTE — Progress Notes (Signed)
Subjective: Heather Brady presents today with painful, thick toenails 1-5 b/l that she cannot cut and which interfere with daily activities.  Pain is aggravated when wearing enclosed shoe gear.  She voices no new problems on today's visit.  Lanice SchwabNymberg, Jerome H, MD is her PCP.   Current Outpatient Medications:  .  amLODipine (NORVASC) 10 MG tablet, Take 5 mg by mouth daily. , Disp: , Rfl:  .  atorvastatin (LIPITOR) 20 MG tablet, Take 20 mg by mouth daily., Disp: , Rfl: 5 .  esomeprazole (NEXIUM) 40 MG capsule, Take 40 mg by mouth daily at 12 noon., Disp: , Rfl:  .  HYDROcodone-acetaminophen (NORCO/VICODIN) 5-325 MG tablet, Take 1 tablet by mouth every 6 (six) hours as needed for moderate pain., Disp: 15 tablet, Rfl: 0 .  ibuprofen (ADVIL,MOTRIN) 800 MG tablet, Take 800 mg by mouth 3 (three) times daily as needed., Disp: , Rfl: 1 .  levothyroxine (SYNTHROID, LEVOTHROID) 75 MCG tablet, , Disp: , Rfl:  .  losartan (COZAAR) 50 MG tablet, , Disp: , Rfl:  .  Multiple Vitamin (MULTIVITAMIN) LIQD, Take 5 mLs by mouth daily., Disp: , Rfl:  .  naproxen sodium (ANAPROX) 220 MG tablet, Take 220 mg by mouth as needed., Disp: , Rfl:  .  Vitamin D, Ergocalciferol, (DRISDOL) 50000 UNITS CAPS capsule, TAKE ONE CAPSULE BY MOUTH EVERY 2 WEEKS, Disp: , Rfl: 5  Allergies  Allergen Reactions  . Amoxicillin Itching  . Carbidopa-Levodopa Itching    Objective:  Vascular Examination: Capillary refill time less than 3 seconds x 10 digits  Dorsalis pedis and Posterior tibial pulses palpable b/l  No hair present x 10 digits  Skin temperature gradient WNL b/l  Dermatological Examination: Skin with normal turgor, texture and tone b/l  Toenails 1-5 b/l discolored, thick, dystrophic with subungual debris and pain with palpation to nailbeds due to thickness of nails.  Musculoskeletal: Muscle strength 5/5 to all LE muscle groups  No gross bony deformities b/l.  No pain, crepitus or joint limitation noted  with ROM.   Neurological: Sensation intact with 10 gram monofilament. Vibratory sensation intact.  Assessment: Painful onychomycosis toenails 1-5 b/l   Plan: 1. Toenails 1-5 b/l were debrided in length and girth without iatrogenic bleeding. 2. Patient to continue soft, supportive shoe gear 3. Patient to report any pedal injuries to medical professional immediately. 4. Follow up 3 months. Patient/POA to call should there be a concern in the interim.

## 2018-07-17 NOTE — Progress Notes (Signed)
Heather Brady was seen today in the movement disorders clinic for neurologic consultation at the request of Dr. Venetia MaxonStern.    Pt is accompanied by her daughter who supplements the history.  The consultation is for the evaluation of dizziness and RLS.  She previously saw Dr. Marjory LiesPenumalli and I have taken an extensive amount of time to review his records.  The patient began to see Dr. Marjory LiesPenumalli in June, 2016.  At that point, gait change, falls and balance issues have been going on for about 1 year.  She was started on a trial of levodopa in July, 2016.  She called back not long thereafter complaining about side effects, although records are unclear about what the side effects were.  Pt states that she had a rash and she d/c the med and restarted it and had a rash again.   She therefore stopped the medication.  She states that she doesn't know if the medication worked.  States that she was referred to Dr. Venetia MaxonStern and he didn't recommend neck surgery.  She was told to come here for 2nd opinion.    Specific Symptoms:  Tremor: No. Family hx of similar:  No. Voice: no change Sleep: sleeps well per pt (not per daughter - after daughter probes her patient admits trouble staying asleep)  Vivid Dreams:  No.  Acting out dreams:  No. Wet Pillows: No. Postural symptoms:  Yes.    Falls?  Yes.   (last fall was few months ago getting into daughters truck when door hit her and she fell on butt; fell in 02/2015 and she hit side of curb and fell and hit head and had 6 stitches) Bradykinesia symptoms: slow movements and difficulty getting out of a chair Loss of smell:  No. Loss of taste:  No. Urinary Incontinence:  Yes.   (doesn't wear undergarments - urinary urgency) Difficulty Swallowing:  No. Handwriting, micrographia: No. (some difficulty writing due to shoulder surgery) Trouble with ADL's:  No.  Trouble buttoning clothing: No. Depression:  No. Memory changes:  Yes.   (short term loss) Hallucinations:   No.  visual distortions: No. N/V:  No. Lightheaded:  No.  Syncope: No. Diplopia:  No. Dyskinesia:  No.   11/02/16 update:  Patient seen today in follow-up.  This patient is accompanied in the office by her daughter who supplements the history.  She had a DaT done at [facility] on 10/12/16 demonstrating grade 1:  Asymmetric uptake with normal or almost normal putamen activity on the right and more marked reduction in the left putamen.  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  She has had multiple falls (3) since our last visit.  With one she leaned on bed for balance and there was a silky blanket that slid out from under her and she slid down.  With one, she was trying to get in the shower and she fell backward.  She didn't hit her head but did hit her back.  With the last one she was carrying stuff in her arms and lost her balance and hit the stove.  She has not gotten a walker.  Neuroimaging has previously been performed.  It available for my review today.  She had an MRI of the cervical spine in June, 2016.  I reviewed this.  There was some disc bulging at the C5-C6 level, but no spinal cord signal change.  MRI of the brain was also done in June, 2016.  I reviewed this as well.  There was mild to moderate small vessel disease.    01/15/17 update:  Patient seen today in follow-up, accompanied by her daughter who supplements the history.  She was started on carbidopa/levodopa 25/100 CR last visit.  She called not long thereafter and stated that it caused diarrhea.  This would be somewhat unusual for this medication.  She also complained about nausea.  She states that she took 1 tablet of rytary and "it paralyzed me."  States that she couldn't move.  Went back to carbidopa/levodopa 25/100 CR.  No longer having diarrhea.  Was doing well with PT but now that not having PT, she is having more trouble.  Has had few falls.  One with walker.  2 involving  attempts to get in/out of tub.  Had swallow study since last visit.  This was normal.  04/13/17 update: Patient seen today in follow-up.  She is accompanied by her daughter who supplements the history.  Patients levodopa was increased last visit so that she is now taking carbidopa/Levodopa 25/100 CR, 2 tablets 3 times per day.  She had 4 falls in the last week but prior to that it had been a while.  With the first fall, she was at the sink and getting in the shower and lost balance.  She has a "love seat" for the bathtub.  She doesn't have anyone to help her bathe.  With the next one, she was in the kitchen and she was trying to move step down to go down from the dining room to the laundry room and she fell (its a converted garage and has a small step).  With the next one (the same day), she was going back to her bedroom and she fell with her walker.   With the next, she was trying to get out of a service mans way and was moving backward and she fell.  No diplopia.  No swallowing issues.    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  I have not seen the patient since 2018.  They report that she is no longer taking carbidopa/levodopa 25/100 and has been off of that for over a year.  She is not doing any home health therapies.  At home, she still uses a walker but daughter states that it is getting worse.  She barely could walk this morning and aren't sure that if it was due to start of gabapentin.  Has had several falls.  Always falls backwards.  No diplopia.  She has no swallowing trouble.  She has no trouble with bladder/bowels.  Daughter has caregiver for 5 days, 4 hours per day and will bathe the patient.  She otherwise lives alone.  Pts daughter would like her to get into skilled living.   Records have been reviewed since last visit.  She was in the emergency room in September with a fall.  She had staples in the right  temporal region.  CT of the brain was done and was unremarkable for acute injury.  PREVIOUS MEDICATIONS: Sinemet; rytary (took 1 pill and said it "paralyzed" her and she couldn't move")  ALLERGIES:   Allergies  Allergen Reactions   Amoxicillin Itching   Carbidopa-Levodopa Itching    CURRENT MEDICATIONS:  Outpatient Encounter Medications as of 07/18/2018  Medication Sig   amLODipine (NORVASC) 10 MG tablet Take 5 mg by mouth daily.    esomeprazole (NEXIUM) 40 MG  capsule Take 40 mg by mouth daily at 12 noon.   gabapentin (NEURONTIN) 100 MG capsule Take 100 mg by mouth 3 (three) times daily.   HYDROcodone-acetaminophen (NORCO/VICODIN) 5-325 MG tablet Take 1 tablet by mouth every 6 (six) hours as needed for moderate pain.   ibuprofen (ADVIL,MOTRIN) 800 MG tablet Take 800 mg by mouth 3 (three) times daily as needed.   levothyroxine (SYNTHROID, LEVOTHROID) 75 MCG tablet    Multiple Vitamin (MULTIVITAMIN) LIQD Take 5 mLs by mouth daily.   naproxen sodium (ANAPROX) 220 MG tablet Take 220 mg by mouth as needed.   Vitamin D, Ergocalciferol, (DRISDOL) 50000 UNITS CAPS capsule TAKE ONE CAPSULE BY MOUTH EVERY 2 WEEKS   [DISCONTINUED] atorvastatin (LIPITOR) 20 MG tablet Take 20 mg by mouth daily.   [DISCONTINUED] losartan (COZAAR) 50 MG tablet    No facility-administered encounter medications on file as of 07/18/2018.     PAST MEDICAL HISTORY:   Past Medical History:  Diagnosis Date   Fibromyalgia    GERD (gastroesophageal reflux disease)    Hypertension    Kidney stones 1982    PAST SURGICAL HISTORY:   Past Surgical History:  Procedure Laterality Date   carpel tunnel  2000   right   NOSE SURGERY  1998   reconstruction   OVARIAN CYST SURGERY  2011   ROTATOR CUFF REPAIR Right     SOCIAL HISTORY:   Social History   Socioeconomic History   Marital status: Single    Spouse name: Not on file   Number of children: 2   Years of education: 13   Highest  education level: Not on file  Occupational History   Occupation: retired    Comment: Medical sales representative strain: Not on file   Food insecurity:    Worry: Not on file    Inability: Not on file   Transportation needs:    Medical: Not on file    Non-medical: Not on file  Tobacco Use   Smoking status: Never Smoker   Smokeless tobacco: Never Used  Substance and Sexual Activity   Alcohol use: No   Drug use: No   Sexual activity: Not on file  Lifestyle   Physical activity:    Days per week: Not on file    Minutes per session: Not on file   Stress: Not on file  Relationships   Social connections:    Talks on phone: Not on file    Gets together: Not on file    Attends religious service: Not on file    Active member of club or organization: Not on file    Attends meetings of clubs or organizations: Not on file    Relationship status: Not on file   Intimate partner violence:    Fear of current or ex partner: Not on file    Emotionally abused: Not on file    Physically abused: Not on file    Forced sexual activity: Not on file  Other Topics Concern   Not on file  Social History Narrative   Lives at home alone   Drinks 16 oz tea daily    FAMILY HISTORY:   Family Status  Relation Name Status   Mother  Deceased at age 6   Father  Deceased at age 37   Sister x3 Alive   Brother 1 Alive   Child x2 Alive    ROS:  Review of Systems  Constitutional: Positive for malaise/fatigue.  HENT:  Negative.   Eyes: Negative.   Respiratory: Negative.   Cardiovascular: Negative.   Gastrointestinal: Negative.   Genitourinary: Negative.   Musculoskeletal: Positive for joint pain (R shoulder pain, R knee pain).       Diffuse pain  Skin: Negative.      PHYSICAL EXAMINATION:    VITALS:   Vitals:   07/18/18 1206  BP: 124/78  Pulse: 84  SpO2: 96%    GEN:  The patient appears stated age and is in NAD. HEENT:  Normocephalic,  atraumatic.  The mucous membranes are moist. The superficial temporal arteries are without ropiness or tenderness.No tongue fasciculations are noted. CV:  RRR Lungs:  CTAB Neck/HEME:  There are no carotid bruits bilaterally.  Neurological examination:  Orientation: The patient is alert and oriented x3. Fund of knowledge is appropriate.  Recent and remote memory are intact.  Attention and concentration are normal.    Able to name objects and repeat phrases. Cranial nerves: There is good facial symmetry. Speech is fluent and clear but monotone and somewhat pseudobulbar.  Extraocular muscles are still intact.  There are no square wave jerks.   Soft palate rises symmetrically and there is no tongue deviation. Hearing is intact to conversational tone. Sensation: Sensation is intact to light touch Motor: Strength is at least antigravity x 4 Deep tendon reflexes: Deep tendon reflexes are 3/4 at the bilateral biceps, triceps, brachioradialis, patella and achilles. Plantar responses are downgoing bilaterally.  Pt placed in gait belt and given walker.    Movement examination: Tone: There is moderate to severe increased tone in the right upper and right lower extremity.  The right leg has such increased tone that it is not even fully flexed at the knee when she sits down. Abnormal movements: none seen or felt today (improved) Coordination: It is very difficult to tell whether there is any decremation with rapid alternating movements, primarily because the patient is either so slow with them, or because of apraxia. Gait and Station: The patient is unable to arise out of the chair by herself and even with two-person assist is a max assist.  She is given a walker, but is unable to walk.  Labs:  The patient had lab work on 04/27/2016.  Sodium was 139, potassium 3.7, chloride 105, CO2 27, BUN 22, creatinine 1.02, AST 20, ALT 16, alkaline phosphatase 78, no M spike in the serum protein electrophoresis  Lab  Results  Component Value Date   VITAMINB12 407 11/02/2016     Lab Results  Component Value Date   WBC 8.9 01/26/2018   HGB 9.0 (L) 01/26/2018   HCT 29.2 (L) 01/26/2018   MCV 80.7 01/26/2018   PLT 274 01/26/2018      ASSESSMENT/PLAN:  1.  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  -Long discussion with patient and her daughter.  She should not be living alone.  She was a max 2 person assist just to get out of the chair today.  In my opinion, she needs 24 hour/day care.  Her daughter is trying to get her into a skilled nursing facility.  I wrote a note today in that regard.  She needs assistance with all of her activities of daily living.  She is 100% disabled.  -Safety discussed in detail.  2.  Diffuse pain.  -Daughter asked me if this disease causes diffuse pain.  Explained that it generally does not.  She was just started on gabapentin yesterday, and her daughter had trouble waking her up today.  I suspect that it likely was the gabapentin, even though it was low dose.  She is to talk to her prescribing physician.  3.  It is very difficult for the patient's daughter to get her out of the home.  Therefore, she will follow-up with me on an as-needed basis.  Much greater than 50% of this visit was spent in counseling and coordinating care.  Total face to face time:  30 min

## 2018-07-18 ENCOUNTER — Encounter: Payer: Self-pay | Admitting: Neurology

## 2018-07-18 ENCOUNTER — Ambulatory Visit: Payer: Medicare Other | Admitting: Neurology

## 2018-07-18 ENCOUNTER — Other Ambulatory Visit: Payer: Self-pay

## 2018-07-18 VITALS — BP 124/78 | HR 84

## 2018-07-18 DIAGNOSIS — G3185 Corticobasal degeneration: Secondary | ICD-10-CM | POA: Diagnosis not present

## 2018-08-16 ENCOUNTER — Ambulatory Visit: Payer: Medicare Other | Admitting: Podiatry

## 2018-08-18 ENCOUNTER — Inpatient Hospital Stay (HOSPITAL_COMMUNITY)
Admission: EM | Admit: 2018-08-18 | Discharge: 2018-08-25 | DRG: 372 | Disposition: A | Discharge status: Skilled Nursing Facility | Payer: Medicare Other | Attending: Internal Medicine | Admitting: Internal Medicine

## 2018-08-18 ENCOUNTER — Other Ambulatory Visit: Payer: Self-pay

## 2018-08-18 ENCOUNTER — Encounter (HOSPITAL_COMMUNITY): Payer: Self-pay

## 2018-08-18 ENCOUNTER — Emergency Department (HOSPITAL_COMMUNITY): Payer: Medicare Other

## 2018-08-18 DIAGNOSIS — K922 Gastrointestinal hemorrhage, unspecified: Secondary | ICD-10-CM | POA: Diagnosis not present

## 2018-08-18 DIAGNOSIS — Z87442 Personal history of urinary calculi: Secondary | ICD-10-CM

## 2018-08-18 DIAGNOSIS — D509 Iron deficiency anemia, unspecified: Secondary | ICD-10-CM | POA: Diagnosis present

## 2018-08-18 DIAGNOSIS — T39395A Adverse effect of other nonsteroidal anti-inflammatory drugs [NSAID], initial encounter: Secondary | ICD-10-CM | POA: Diagnosis present

## 2018-08-18 DIAGNOSIS — E039 Hypothyroidism, unspecified: Secondary | ICD-10-CM | POA: Diagnosis present

## 2018-08-18 DIAGNOSIS — I10 Essential (primary) hypertension: Secondary | ICD-10-CM | POA: Diagnosis present

## 2018-08-18 DIAGNOSIS — M797 Fibromyalgia: Secondary | ICD-10-CM | POA: Diagnosis present

## 2018-08-18 DIAGNOSIS — Z888 Allergy status to other drugs, medicaments and biological substances status: Secondary | ICD-10-CM

## 2018-08-18 DIAGNOSIS — K219 Gastro-esophageal reflux disease without esophagitis: Secondary | ICD-10-CM | POA: Diagnosis present

## 2018-08-18 DIAGNOSIS — Z880 Allergy status to penicillin: Secondary | ICD-10-CM

## 2018-08-18 DIAGNOSIS — D649 Anemia, unspecified: Secondary | ICD-10-CM

## 2018-08-18 DIAGNOSIS — Z66 Do not resuscitate: Secondary | ICD-10-CM | POA: Diagnosis present

## 2018-08-18 DIAGNOSIS — G20 Parkinson's disease: Secondary | ICD-10-CM | POA: Diagnosis present

## 2018-08-18 DIAGNOSIS — Z79899 Other long term (current) drug therapy: Secondary | ICD-10-CM

## 2018-08-18 DIAGNOSIS — G8929 Other chronic pain: Secondary | ICD-10-CM | POA: Diagnosis present

## 2018-08-18 DIAGNOSIS — A0472 Enterocolitis due to Clostridium difficile, not specified as recurrent: Secondary | ICD-10-CM | POA: Diagnosis not present

## 2018-08-18 DIAGNOSIS — K529 Noninfective gastroenteritis and colitis, unspecified: Secondary | ICD-10-CM

## 2018-08-18 DIAGNOSIS — M549 Dorsalgia, unspecified: Secondary | ICD-10-CM | POA: Diagnosis present

## 2018-08-18 DIAGNOSIS — Z8711 Personal history of peptic ulcer disease: Secondary | ICD-10-CM

## 2018-08-18 DIAGNOSIS — Z6821 Body mass index (BMI) 21.0-21.9, adult: Secondary | ICD-10-CM

## 2018-08-18 DIAGNOSIS — E876 Hypokalemia: Secondary | ICD-10-CM | POA: Diagnosis present

## 2018-08-18 DIAGNOSIS — R195 Other fecal abnormalities: Secondary | ICD-10-CM | POA: Diagnosis present

## 2018-08-18 DIAGNOSIS — M25519 Pain in unspecified shoulder: Secondary | ICD-10-CM

## 2018-08-18 DIAGNOSIS — R64 Cachexia: Secondary | ICD-10-CM | POA: Diagnosis present

## 2018-08-18 DIAGNOSIS — Z79890 Hormone replacement therapy (postmenopausal): Secondary | ICD-10-CM

## 2018-08-18 LAB — COMPREHENSIVE METABOLIC PANEL
ALT: 9 U/L (ref 0–44)
AST: 14 U/L — ABNORMAL LOW (ref 15–41)
Albumin: 3.3 g/dL — ABNORMAL LOW (ref 3.5–5.0)
Alkaline Phosphatase: 95 U/L (ref 38–126)
Anion gap: 9 (ref 5–15)
BUN: 18 mg/dL (ref 8–23)
CO2: 26 mmol/L (ref 22–32)
Calcium: 9.7 mg/dL (ref 8.9–10.3)
Chloride: 101 mmol/L (ref 98–111)
Creatinine, Ser: 0.98 mg/dL (ref 0.44–1.00)
GFR calc Af Amer: >60 mL/min (ref >60)
GFR calc non Af Amer: 58 mL/min — ABNORMAL LOW (ref >60)
Glucose, Bld: 119 mg/dL — ABNORMAL HIGH (ref 70–99)
Potassium: 2.8 mmol/L — ABNORMAL LOW (ref 3.5–5.1)
Sodium: 136 mmol/L (ref 135–145)
Total Bilirubin: 0.5 mg/dL (ref 0.3–1.2)
Total Protein: 7.1 g/dL (ref 6.5–8.1)

## 2018-08-18 LAB — CBC
HCT: 25.6 % — ABNORMAL LOW (ref 36.0–46.0)
Hemoglobin: 7 g/dL — ABNORMAL LOW (ref 12.0–15.0)
MCH: 19.7 pg — ABNORMAL LOW (ref 26.0–34.0)
MCHC: 27.3 g/dL — ABNORMAL LOW (ref 30.0–36.0)
MCV: 72.1 fL — ABNORMAL LOW (ref 80.0–100.0)
Platelets: 305 10*3/uL (ref 150–400)
RBC: 3.55 MIL/uL — ABNORMAL LOW (ref 3.87–5.11)
RDW: 15.9 % — ABNORMAL HIGH (ref 11.5–15.5)
WBC: 11.1 10*3/uL — ABNORMAL HIGH (ref 4.0–10.5)
nRBC: 0 % (ref 0.0–0.2)

## 2018-08-18 LAB — MAGNESIUM: Magnesium: 1.7 mg/dL (ref 1.7–2.4)

## 2018-08-18 LAB — ABO/RH: ABO/RH(D): A POS

## 2018-08-18 LAB — PREPARE RBC (CROSSMATCH)

## 2018-08-18 LAB — POC OCCULT BLOOD, ED: Fecal Occult Bld: NEGATIVE

## 2018-08-18 LAB — LIPASE, BLOOD: Lipase: 23 U/L (ref 11–51)

## 2018-08-18 MED ORDER — ACETAMINOPHEN 325 MG PO TABS
650.0000 mg | ORAL_TABLET | Freq: Four times a day (QID) | ORAL | Status: DC | PRN
  Administered 2018-08-19 – 2018-08-23 (×4): 650 mg via ORAL
  Filled 2018-08-18 (×4): qty 2

## 2018-08-18 MED ORDER — SODIUM CHLORIDE (PF) 0.9 % IJ SOLN
INTRAMUSCULAR | Status: AC
  Filled 2018-08-18: qty 50

## 2018-08-18 MED ORDER — ACETAMINOPHEN 650 MG RE SUPP: 650.0000 mg | Freq: Four times a day (QID) | RECTAL | Status: DC | PRN

## 2018-08-18 MED ORDER — METRONIDAZOLE IN NACL 5-0.79 MG/ML-% IV SOLN
500.0000 mg | Freq: Once | INTRAVENOUS | Status: AC
  Administered 2018-08-18: 23:00:00 500 mg via INTRAVENOUS
  Filled 2018-08-18: qty 100

## 2018-08-18 MED ORDER — IOHEXOL 300 MG/ML  SOLN
100.0000 mL | Freq: Once | INTRAMUSCULAR | Status: AC | PRN
  Administered 2018-08-18: 19:00:00 100 mL via INTRAVENOUS

## 2018-08-18 MED ORDER — CIPROFLOXACIN IN D5W 400 MG/200ML IV SOLN
400.0000 mg | Freq: Once | INTRAVENOUS | Status: AC
  Administered 2018-08-18: 400 mg via INTRAVENOUS
  Filled 2018-08-18: qty 200

## 2018-08-18 MED ORDER — SODIUM CHLORIDE 0.9 % IV SOLN
80.0000 mg | Freq: Once | INTRAVENOUS | Status: AC
  Administered 2018-08-18: 80 mg via INTRAVENOUS
  Filled 2018-08-18: qty 80

## 2018-08-18 MED ORDER — CIPROFLOXACIN IN D5W 400 MG/200ML IV SOLN
400.0000 mg | Freq: Two times a day (BID) | INTRAVENOUS | Status: DC
  Administered 2018-08-19 – 2018-08-20 (×3): 400 mg via INTRAVENOUS
  Filled 2018-08-18 (×3): qty 200

## 2018-08-18 MED ORDER — POTASSIUM CHLORIDE 10 MEQ/100ML IV SOLN
10.0000 meq | INTRAVENOUS | Status: DC
  Administered 2018-08-18: 21:00:00 10 meq via INTRAVENOUS
  Filled 2018-08-18: qty 100

## 2018-08-18 MED ORDER — METRONIDAZOLE IN NACL 5-0.79 MG/ML-% IV SOLN: 500.0000 mg | Freq: Once | INTRAVENOUS | Status: DC

## 2018-08-18 MED ORDER — SODIUM CHLORIDE 0.9 % IV SOLN
8.0000 mg/h | INTRAVENOUS | Status: DC
  Administered 2018-08-18 – 2018-08-20 (×4): 8 mg/h via INTRAVENOUS
  Filled 2018-08-18 (×4): qty 80

## 2018-08-18 MED ORDER — SODIUM CHLORIDE 0.9 % IV SOLN
INTRAVENOUS | Status: AC
  Administered 2018-08-18: 22:00:00 via INTRAVENOUS

## 2018-08-18 MED ORDER — SODIUM CHLORIDE 0.9% IV SOLUTION
Freq: Once | INTRAVENOUS | Status: AC
  Administered 2018-08-18: 20:00:00 via INTRAVENOUS

## 2018-08-18 MED ORDER — POTASSIUM CHLORIDE CRYS ER 20 MEQ PO TBCR
60.0000 meq | EXTENDED_RELEASE_TABLET | Freq: Once | ORAL | Status: AC
  Administered 2018-08-18: 22:00:00 60 meq via ORAL
  Filled 2018-08-18: qty 3

## 2018-08-18 MED ORDER — HYDRALAZINE HCL 20 MG/ML IJ SOLN
5.0000 mg | INTRAMUSCULAR | Status: DC | PRN
  Administered 2018-08-20 – 2018-08-24 (×9): 5 mg via INTRAVENOUS
  Filled 2018-08-18 (×9): qty 1

## 2018-08-18 MED ORDER — SODIUM CHLORIDE 0.9 % IV BOLUS
500.0000 mL | Freq: Once | INTRAVENOUS | Status: AC
  Administered 2018-08-18: 21:00:00 500 mL via INTRAVENOUS

## 2018-08-18 MED ORDER — METRONIDAZOLE IN NACL 5-0.79 MG/ML-% IV SOLN
500.0000 mg | Freq: Three times a day (TID) | INTRAVENOUS | Status: DC
  Administered 2018-08-19 – 2018-08-20 (×4): 500 mg via INTRAVENOUS
  Filled 2018-08-18 (×4): qty 100

## 2018-08-18 NOTE — ED Triage Notes (Signed)
Patient reports bright red stools yesterday and dark stools today. Patient also c/o abdominal pain, but denies N/v.

## 2018-08-18 NOTE — ED Notes (Signed)
Patient transported to CT 

## 2018-08-18 NOTE — ED Provider Notes (Addendum)
[facility]-EMERGENCY DEPT Provider Note   CSN: [id number] Arrival date & time: 08/18/18  1806    History   Chief Complaint Chief Complaint  Patient presents with   GI Bleeding   Abdominal Pain    HPI Heather Brady is a 73 y.o. female.     HPI  Pt is a 73 y/o F with a h/o GERD, fibromyalgia, HTN, kidney stones, parkinsonism, gait difficulty, who presents to the ED today c/o bloody stools that began yesterday. Pt states she began to have bright red stools yesterday night (x4). This AM has had 4 episodes of dark stools. Sxs associated with 5/10 intermittent abd pain, located mostly to the lower abdomen.  No NV. No lightheadedness, dizziness, chest pain or sob. Denies use of blood thinners. No heavy NSAID use or ETOH use. She has never had sxs like this before.  He does note that she saw a GI doctor about 20 years ago because she has a history of ulcers.  Past Medical History:  Diagnosis Date   Fibromyalgia    GERD (gastroesophageal reflux disease)    Hypertension    Kidney stones 1982    Patient Active Problem List   Diagnosis Date Noted   GI bleed 08/18/2018   H/O vitamin D deficiency 12/28/2017   Hypercalcemia 06/08/2017   Gait difficulty 10/20/2014   Parkinsonism (HCC) 10/20/2014   Dizziness and giddiness 10/20/2014    Past Surgical History:  Procedure Laterality Date   carpel tunnel  2000   right   NOSE SURGERY  1998   reconstruction   OVARIAN CYST SURGERY  2011   ROTATOR CUFF REPAIR Right      OB History   No obstetric history on file.      Home Medications    Prior to Admission medications   Medication Sig Start Date End Date Taking? Authorizing Provider  amLODipine (NORVASC) 10 MG tablet Take 5 mg by mouth daily.     [provider]  esomeprazole (NEXIUM) 40 MG capsule Take 40 mg by mouth daily at 12 noon.    [provider]  gabapentin (NEURONTIN) 100 MG capsule Take 100 mg by mouth 3  (three) times daily. 07/16/18   [provider]  HYDROcodone-acetaminophen (NORCO/VICODIN) 5-325 MG tablet Take 1 tablet by mouth every 6 (six) hours as needed for moderate pain. 05/12/15   Shade Flood, MD  ibuprofen (ADVIL,MOTRIN) 800 MG tablet Take 800 mg by mouth 3 (three) times daily as needed. 07/29/15   [provider]  levothyroxine (SYNTHROID, LEVOTHROID) 75 MCG tablet  10/06/14   [provider]  Multiple Vitamin (MULTIVITAMIN) LIQD Take 5 mLs by mouth daily.    [provider]  naproxen sodium (ANAPROX) 220 MG tablet Take 220 mg by mouth as needed.    [provider]  Vitamin D, Ergocalciferol, (DRISDOL) 50000 UNITS CAPS capsule TAKE ONE CAPSULE BY MOUTH EVERY 2 WEEKS 08/03/14   [provider]    Family History Family History  Problem Relation Age of Onset   Healthy Mother    Healthy Father     Social History Social History   Tobacco Use   Smoking status: Never Smoker   Smokeless tobacco: Never Used  Substance Use Topics   Alcohol use: No   Drug use: No     Allergies   Amoxicillin and Carbidopa-levodopa   Review of Systems Review of Systems  Constitutional: Negative for fever.  HENT: Negative for ear pain and sore  throat.   Eyes: Negative for visual disturbance.  Respiratory: Negative for cough and shortness of breath.   Cardiovascular: Negative for chest pain.  Gastrointestinal: Positive for abdominal pain and blood in stool. Negative for constipation, diarrhea, nausea and vomiting.       Painful BMs  Genitourinary: Negative for dysuria, hematuria and urgency.  Musculoskeletal: Negative for back pain.  Skin: Negative for rash.  Neurological: Negative for dizziness, weakness, light-headedness and headaches.  All other systems reviewed and are negative.   Physical Exam Updated Vital Signs BP (!) 155/73 (BP Location: Right Arm)    Pulse 90    Temp 98.2 F (36.8 C) (Oral)    Resp 19    Ht   (1.753 m)    Wt 65.8 kg    SpO2 98%    BMI 21.41 kg/m   Physical Exam Vitals signs and nursing note reviewed.  Constitutional:      General: She is not in acute distress.    Appearance: She is well-developed.  HENT:     Head: Normocephalic and atraumatic.  Eyes:     Conjunctiva/sclera: Conjunctivae normal.     Comments: Pale conjunctiva  Neck:     Musculoskeletal: Neck supple.  Cardiovascular:     Rate and Rhythm: Normal rate and regular rhythm.     Pulses: Normal pulses.     Heart sounds: Normal heart sounds. No murmur.  Pulmonary:     Effort: Pulmonary effort is normal. No respiratory distress.     Breath sounds: Normal breath sounds. No stridor. No wheezing, rhonchi or rales.  Abdominal:     General: Bowel sounds are normal.     Palpations: Abdomen is soft.     Comments: Generalized abd tenderness, worse to the periumbilical area. Minimal guarding. No rebound tenderness. No rigidity. No CVA TTP bilat.   Skin:    General: Skin is warm and dry.     Comments: pale  Neurological:     Mental Status: She is alert.     Comments: Alert, oriented.     ED Treatments / Results  Labs (all labs ordered are listed, but only abnormal results are displayed) Labs Reviewed  COMPREHENSIVE METABOLIC PANEL - Abnormal; Notable for the following components:      Result Value   Potassium 2.8 (*)    Glucose, Bld 119 (*)    Albumin 3.3 (*)    AST 14 (*)    GFR calc non Af Amer 58 (*)    All other components within normal limits  CBC - Abnormal; Notable for the following components:   WBC 11.1 (*)    RBC 3.55 (*)    Hemoglobin 7.0 (*)    HCT 25.6 (*)    MCV 72.1 (*)    MCH 19.7 (*)    MCHC 27.3 (*)    RDW 15.9 (*)    All other components within normal limits  GASTROINTESTINAL PANEL BY PCR, STOOL (REPLACES STOOL CULTURE)  C DIFFICILE QUICK SCREEN W PCR REFLEX  LIPASE, BLOOD  MAGNESIUM  POC OCCULT BLOOD, ED  TYPE AND SCREEN  PREPARE RBC (CROSSMATCH)  ABO/RH     EKG None  Radiology Ct Abdomen Pelvis W Contrast  Result Date: 08/18/2018 CLINICAL DATA:  Abdominal pain and GI bleeding EXAM: CT ABDOMEN AND PELVIS WITH CONTRAST TECHNIQUE: Multidetector CT imaging of the abdomen and pelvis was performed using the standard protocol following bolus administration of intravenous contrast. CONTRAST:  OMNIPAQUE 300 COMPARISON:  None. FINDINGS: Lower  chest: Lung bases are free of acute infiltrate or sizable effusion. Hepatobiliary: Liver is well visualized and within normal limits. The gallbladder is decompressed with gallstones within. No pericholecystic fluid is noted. Pancreas: Unremarkable. No pancreatic ductal dilatation or surrounding inflammatory changes. Spleen: Normal in size without focal abnormality. Adrenals/Urinary Tract: Adrenal glands are within normal limits. Kidneys are well visualized bilaterally. No renal calculi or urinary tract obstructive changes are seen. There is a 1.9 cm cyst in the midportion of the right kidney. Delayed images demonstrate normal excretion of contrast material. The bladder is well distended and within normal limits. Stomach/Bowel: Scattered diverticular change of the colon is noted. Mild inflammatory changes are noted about the cecum and ascending colon. Similar changes are noted in the region of the rectum as well. The appendix is visualized without significant dilatation at the upper limits of normal in size. No significant peri appendiceal inflammatory changes are noted to suggest appendicitis. Small bowel shows no obstructive changes. A large hiatal hernia is noted with the majority of the stomach within the chest cavity. Vascular/Lymphatic: Atherosclerotic calcifications are noted without aneurysmal dilatation or dissection. No significant lymphadenopathy is seen. Reproductive: Uterus has been surgically removed. Other: Minimal free pelvic fluid is noted.  No free air is seen. Musculoskeletal: Degenerative changes of  lumbar spine are noted. No compression deformities are seen. Degenerative anterolisthesis of L4 on L5 is noted. IMPRESSION: Scattered areas of inflammatory change involving the colon primarily in the ascending colon and cecum as well as the rectum suggestive of multifocal colitis. No obstructive changes are seen. No changes of perforation are noted. Upper limits of normal appendix without significant periappendiceal inflammatory change. Diverticulosis without diverticulitis. Cholelithiasis without complicating factors. Electronically Signed   By: Alcide CleverMark  Lukens M.D.   On: 08/18/2018 19:48    Procedures Procedures (including critical care time) CRITICAL CARE Performed by: Karrie Meresortni S Katrin Grabel   Total critical care time: 35 minutes  Critical care time was exclusive of separately billable procedures and treating other patients.  Critical care was necessary to treat or prevent imminent or life-threatening deterioration.  Critical care was time spent personally by me on the following activities: development of treatment plan with patient and/or surrogate as well as nursing, discussions with consultants, evaluation of patient's response to treatment, examination of patient, obtaining history from patient or surrogate, ordering and performing treatments and interventions, ordering and review of laboratory studies, ordering and review of radiographic studies, pulse oximetry and re-evaluation of patient's condition.   Medications Ordered in ED Medications  potassium chloride 10 mEq in 100 mL IVPB (10 mEq Intravenous New Bag/Given 08/18/18 2044)  pantoprazole (PROTONIX) 80 mg in sodium chloride 0.9 % 250 mL (0.32 mg/mL) infusion (8 mg/hr Intravenous New Bag/Given 08/18/18 2109)  sodium chloride (PF) 0.9 % injection (has no administration in time range)  ciprofloxacin (CIPRO) IVPB 400 mg (has no administration in time range)  metroNIDAZOLE (FLAGYL) IVPB 500 mg (has no administration in time range)  0.9 %   sodium chloride infusion (Manually program via Guardrails IV Fluids) ( Intravenous New Bag/Given 08/18/18 2016)  sodium chloride 0.9 % bolus 500 mL (0 mLs Intravenous Stopped 08/18/18 2118)  pantoprazole (PROTONIX) 80 mg in sodium chloride 0.9 % 100 mL IVPB (0 mg Intravenous Stopped 08/18/18 2110)  iohexol (OMNIPAQUE) 300 MG/ML solution 100 mL (100 mLs Intravenous Contrast Given 08/18/18 1929)     Initial Impression / Assessment and Plan / ED Course  I have reviewed the triage vital signs and the nursing notes.  Pertinent labs & imaging results that were available during my care of the patient were reviewed by me and considered in my medical decision making (see chart for details).     Final Clinical Impressions(s) / ED Diagnoses   Final diagnoses:  Colitis  Symptomatic anemia   73 year old female with history of PUD presenting the ED today for evaluation of rectal bleeding starting yesterday.  Had 4 episodes of bright red blood per rectum yesterday.  Today had 4 episodes of dark stools.  Associated with abdominal pain.  No nausea vomiting.  No lightheadedness or dizziness.  Vital signs are reassuring.  No tachycardia or hypotension.  Patient appears pale.  Abdomen is soft, normal active bowel sounds.  There is generalized tenderness worse in the periumbilical area.  There is no rigidity or rebound tenderness.  There is guarding.  Digital rectal exam without evidence of melena.  Scant amount of brown stool noted in rectal vault.  CBC with hemoglobin of 7, decreased from prior 6 months ago when it was 9.  Also has a mild leukocytosis. CMP with hypokalemia, worse from prior.  Patient given 3 doses IV potassium in the ED.  Normal kidney and liver function. 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 No evidence of prolonged QTc.  Patient given small fluid bolus in the ED.  Also given 1 unit PRBCs given low  hemoglobin.  She was given Protonix bolus and infusion given concern for possible upper GI bleed in setting of known history of PUD.  CT abd/pelvis with scattered areas of inflammatory change involving the colon primarily in the ascending colon and cecum as well as the rectum suggestive of multifocal colitis. No obstructive changes are seen. No changes of perforation are noted. Upper limits of normal appendix without significant periappendiceal inflammatory change. Diverticulosis without diverticulitis. Cholelithiasis without complicating factors.  8:23 PM CONSULT with Dr. Marca Ancona with gastroenterology who recommends IV cipro/flagyl and to send c.diff and GI pathogen panel. She will consult on pt in the AM  Case discussed with hospitalist who accepts pt for admission.  ED Discharge Orders    None       Karrie Meres, PA-C 08/18/18 2119    Karrie Meres, PA-C 08/18/18 2126    Little, Ambrose Finland, MD 08/18/18 2141

## 2018-08-18 NOTE — ED Notes (Signed)
(  Daughter) Francene Castle [phone].

## 2018-08-18 NOTE — ED Notes (Signed)
Bed: PF79 Expected date:  Expected time:  Means of arrival:  Comments: Triage gi bleed

## 2018-08-18 NOTE — ED Notes (Signed)
ED TO INPATIENT HANDOFF REPORT   S Name/Age/Gender Heather Brady 73 y.o. female Room/Bed: WA14/WA14  Code Status   Code Status: Not on file  Home/SNF/Other Home Patient oriented to: self, place, time and situation Is this baseline? Yes   Triage Complete: Triage complete  Chief Complaint Diahrrea / Black Stool   Triage Note Patient reports bright red stools yesterday and dark stools today. Patient also c/o abdominal pain, but denies N/v.   Allergies Allergies  Allergen Reactions  . Amoxicillin Itching  . Carbidopa-Levodopa Itching    Level of Care/Admitting Diagnosis ED Disposition    ED Disposition Condition Comment   Admit  Hospital Area: [facility] [[id number]]  Level of Care: Med-Surg [16]  Diagnosis: GI bleed [409811]  Admitting Physician: John Giovanni [[id number]]  Attending Physician: John Giovanni [[id number]]  PT Class (Do Not Modify): Observation [104]  PT Acc Code (Do Not Modify): Observation [10022]       B Medical/Surgery History Past Medical History:  Diagnosis Date  . Fibromyalgia   . GERD (gastroesophageal reflux disease)   . Hypertension   . Kidney stones 1982   Past Surgical History:  Procedure Laterality Date  . carpel tunnel  2000   right  . NOSE SURGERY  1998   reconstruction  . OVARIAN CYST SURGERY  2011  . ROTATOR CUFF REPAIR Right      A IV Location/Drains/Wounds Patient Lines/Drains/Airways Status   Active Line/Drains/Airways    Name:   Placement date:   Placement time:   Site:   Days:   Peripheral IV 08/18/18 Left Antecubital   08/18/18    1830    Antecubital   less than 1   Peripheral IV 08/18/18 Right Forearm   08/18/18    2036    Forearm   less than 1          Intake/Output Last 24 hours  Intake/Output Summary (Last 24 hours) at 08/18/2018 2113 Last data filed at 08/18/2018 2110 Gross per 24 hour  Intake 100 ml  Output -  Net 100 ml    Labs/Imaging Results for orders placed or  performed during the hospital encounter of 08/18/18 (from the past 48 hour(s))  Comprehensive metabolic panel     Status: Abnormal   Collection Time: 08/18/18  6:18 PM  Result Value Ref Range   Sodium 136 135 - 145 mmol/L   Potassium 2.8 (L) 3.5 - 5.1 mmol/L   Chloride 101 98 - 111 mmol/L   CO2 26 22 - 32 mmol/L   Glucose, Bld 119 (H) 70 - 99 mg/dL   BUN 18 8 - 23 mg/dL   Creatinine, Ser 6.57 0.44 - 1.00 mg/dL   Calcium 9.7 8.9 - 84.6 mg/dL   Total Protein 7.1 6.5 - 8.1 g/dL   Albumin 3.3 (L) 3.5 - 5.0 g/dL   AST 14 (L) 15 - 41 U/L   ALT 9 0 - 44 U/L   Alkaline Phosphatase 95 38 - 126 U/L   Total Bilirubin 0.5 0.3 - 1.2 mg/dL   GFR calc non Af Amer 58 (L) >60 mL/min   GFR calc Af Amer >60 >60 mL/min   Anion gap 9 5 - 15    Comment: Performed at [facility], [place], [address] [place], [place] [address]  CBC     Status: Abnormal   Collection Time: 08/18/18  6:18 PM  Result Value Ref Range   WBC 11.1 (H) 4.0 - 10.5 K/uL   RBC  3.55 (L) 3.87 - 5.11 MIL/uL   Hemoglobin 7.0 (L) 12.0 - 15.0 g/dL    Comment: Reticulocyte Hemoglobin testing may be clinically indicated, consider ordering this additional test [id number]    HCT 25.6 (L) 36.0 - 46.0 %   MCV 72.1 (L) 80.0 - 100.0 fL   MCH 19.7 (L) 26.0 - 34.0 pg   MCHC 27.3 (L) 30.0 - 36.0 g/dL   RDW 69.7 (H) 94.8 - 01.6 %   Platelets 305 150 - 400 K/uL   nRBC 0.0 0.0 - 0.2 %    Comment: Performed at [facility], [address] [place], [place] [address]  Type and screen [facility]     Status: None (Preliminary result)   Collection Time: 08/18/18  6:18 PM  Result Value Ref Range   ABO/RH(D) A POS    Antibody Screen NEG    Sample Expiration 08/21/2018    Unit Number [id number]    Blood Component Type RED CELLS,LR    Unit division 00    Status of Unit ISSUED    Transfusion Status OK TO TRANSFUSE    Crossmatch Result      Compatible Performed at [facility], [address] [place], [place] [address]   ABO/Rh     Status: None   Collection Time: 08/18/18  6:27 PM  Result Value Ref Range   ABO/RH(D)      A POS Performed at [facility], [address], [place], [place] [address]   POC occult blood, ED     Status: None   Collection Time: 08/18/18  6:43 PM  Result Value Ref Range   Fecal Occult Bld NEGATIVE NEGATIVE  Prepare RBC     Status: None   Collection Time: 08/18/18  6:59 PM  Result Value Ref Range   Order Confirmation      ORDER PROCESSED BY BLOOD BANK Performed at [facility], [address] [place], [place] [address]   Lipase, blood     Status: None   Collection Time: 08/18/18  7:20 PM  Result Value Ref Range   Lipase 23 11 - 51 U/L    Comment: Performed at [facility], [address] [place], [place] [address]  Magnesium     Status: None   Collection Time: 08/18/18  7:20 PM  Result Value Ref Range   Magnesium 1.7 1.7 - 2.4 mg/dL    Comment: Performed at [facility], [address], [place], [place] [address]   Ct Abdomen Pelvis W Contrast  Result Date: 08/18/2018 CLINICAL DATA:  Abdominal pain and GI bleeding EXAM: CT ABDOMEN AND PELVIS WITH CONTRAST TECHNIQUE: Multidetector CT imaging of the abdomen and pelvis was performed using the standard protocol following bolus administration of intravenous contrast. CONTRAST:  OMNIPAQUE 300 COMPARISON:  None. FINDINGS: Lower chest: Lung bases are free of acute infiltrate or sizable effusion. Hepatobiliary: Liver is well visualized and within normal limits. The gallbladder is decompressed with gallstones within. No pericholecystic fluid is noted. Pancreas: Unremarkable. No pancreatic ductal dilatation or surrounding inflammatory changes. Spleen: Normal in size without focal abnormality. Adrenals/Urinary Tract: Adrenal glands are within normal limits. Kidneys are well visualized bilaterally. No  renal calculi or urinary tract obstructive changes are seen. There is a 1.9 cm cyst in the midportion of the right kidney. Delayed images demonstrate normal excretion of contrast material. The bladder is well distended and within normal limits. Stomach/Bowel: Scattered diverticular  change of the colon is noted. Mild inflammatory changes are noted about the cecum and ascending colon. Similar changes are noted in the region of the rectum as well. The appendix is visualized without significant dilatation at the upper limits of normal in size. No significant peri appendiceal inflammatory changes are noted to suggest appendicitis. Small bowel shows no obstructive changes. A large hiatal hernia is noted with the majority of the stomach within the chest cavity. Vascular/Lymphatic: Atherosclerotic calcifications are noted without aneurysmal dilatation or dissection. No significant lymphadenopathy is seen. Reproductive: Uterus has been surgically removed. Other: Minimal free pelvic fluid is noted.  No free air is seen. Musculoskeletal: Degenerative changes of lumbar spine are noted. No compression deformities are seen. Degenerative anterolisthesis of L4 on L5 is noted. IMPRESSION: Scattered areas of inflammatory change involving the colon primarily in the ascending colon and cecum as well as the rectum suggestive of multifocal colitis. No obstructive changes are seen. No changes of perforation are noted. Upper limits of normal appendix without significant periappendiceal inflammatory change. Diverticulosis without diverticulitis. Cholelithiasis without complicating factors. Electronically Signed   By: Alcide CleverMark  Lukens M.D.   On: 08/18/2018 19:48    Pending Labs Unresulted Labs (From admission, onward)    Start     Ordered   08/18/18 2027  Gastrointestinal Panel by PCR , Stool  (Gastrointestinal Panel by PCR, Stool)  Once,   R     08/18/18 2027   08/18/18 2027  C difficile quick scan w PCR reflex  (C Difficile quick  screen w PCR reflex panel)  Once, for 24 hours,   R     08/18/18 2027          Vitals/Pain Today's Vitals   08/18/18 2011 08/18/18 2030 08/18/18 2105 08/18/18 2111  BP: (!) 156/66 (!) 158/73 (!) 188/103 (!) 155/73  Pulse: 95 92 94 90  Resp: 16 17 18 19   Temp: 98.6 F (37 C)   98.2 F (36.8 C)  TempSrc: Oral   Oral  SpO2: 97% 96% 98% 98%  Weight:      Height:      PainSc:        Isolation Precautions Enteric precautions (UV disinfection)  Medications Medications  potassium chloride 10 mEq in 100 mL IVPB (10 mEq Intravenous New Bag/Given 08/18/18 2044)  pantoprazole (PROTONIX) 80 mg in sodium chloride 0.9 % 250 mL (0.32 mg/mL) infusion (8 mg/hr Intravenous New Bag/Given 08/18/18 2109)  sodium chloride (PF) 0.9 % injection (has no administration in time range)  ciprofloxacin (CIPRO) IVPB 400 mg (has no administration in time range)  metroNIDAZOLE (FLAGYL) IVPB 500 mg (has no administration in time range)  0.9 %  sodium chloride infusion (Manually program via Guardrails IV Fluids) ( Intravenous New Bag/Given 08/18/18 2016)  sodium chloride 0.9 % bolus 500 mL (500 mLs Intravenous New Bag/Given 08/18/18 2040)  pantoprazole (PROTONIX) 80 mg in sodium chloride 0.9 % 100 mL IVPB (0 mg Intravenous Stopped 08/18/18 2110)  iohexol (OMNIPAQUE) 300 MG/ML solution 100 mL (100 mLs Intravenous Contrast Given 08/18/18 1929)    Mobility walks with device Moderate fall risk

## 2018-08-18 NOTE — H&P (Addendum)
History and Physical    Heather Brady [id number]:[id number] DOB: 05/20/1945 DOA: 08/18/2018  PCP: Lanice Schwab, MD Patient coming from: Home  Chief Complaint: Dark stools  HPI: Heather Brady is a 73 y.o. female with medical history significant of PUD, GERD, fibromyalgia, hypertension, nephrolithiasis, parkinsonism, gait difficulty presenting to the hospital for evaluation of dark stools.  Patient states since this morning she has had 3 episodes of dark black-colored stools.  Denies noticing any bright red blood in her stool.  Reports bilateral lower quadrant constant dull pain.  She takes NSAIDs for chronic back pain regularly 3 times a day.  States she had a GI bleed previously 25 years ago and had an endoscopy at that time.  Reports history of ulcers.  States she had colonoscopy done 3 years ago and was told it was clear.  Denies any lightheadedness, chest pain, or shortness of breath.  Denies any fevers, chills, cough, or dysuria.  Review of Systems: As per HPI otherwise 10 point review of systems negative.  Past Medical History:  Diagnosis Date  . Fibromyalgia   . GERD (gastroesophageal reflux disease)   . Hypertension   . Kidney stones 1982    Past Surgical History:  Procedure Laterality Date  . carpel tunnel  2000   right  . NOSE SURGERY  1998   reconstruction  . OVARIAN CYST SURGERY  2011  . ROTATOR CUFF REPAIR Right      reports that she has never smoked. She has never used smokeless tobacco. She reports that she does not drink alcohol or use drugs.  Allergies  Allergen Reactions  . Amoxicillin Itching  . Carbidopa-Levodopa Itching    Family History  Problem Relation Age of Onset  . Healthy Mother   . Healthy Father     Prior to Admission medications   Medication Sig Start Date End Date Taking? Authorizing Provider  amLODipine (NORVASC) 10 MG tablet Take 5 mg by mouth daily.     [provider]  esomeprazole (NEXIUM) 40 MG capsule Take 40 mg by mouth  daily at 12 noon.    [provider]  gabapentin (NEURONTIN) 100 MG capsule Take 100 mg by mouth 3 (three) times daily. 07/16/18   [provider]  HYDROcodone-acetaminophen (NORCO/VICODIN) 5-325 MG tablet Take 1 tablet by mouth every 6 (six) hours as needed for moderate pain. 05/12/15   Shade Flood, MD  ibuprofen (ADVIL,MOTRIN) 800 MG tablet Take 800 mg by mouth 3 (three) times daily as needed. 07/29/15   [provider]  levothyroxine (SYNTHROID, LEVOTHROID) 75 MCG tablet  10/06/14   [provider]  Multiple Vitamin (MULTIVITAMIN) LIQD Take 5 mLs by mouth daily.    [provider]  naproxen sodium (ANAPROX) 220 MG tablet Take 220 mg by mouth as needed.    [provider]  Vitamin D, Ergocalciferol, (DRISDOL) 50000 UNITS CAPS capsule TAKE ONE CAPSULE BY MOUTH EVERY 2 WEEKS 08/03/14   [provider]    Physical Exam: Vitals:   08/18/18 2011 08/18/18 2030 08/18/18 2105 08/18/18 2111  BP: (!) 156/66 (!) 158/73 (!) 188/103 (!) 155/73  Pulse: 95 92 94 90  Resp: Temp: 98.6 F (37 C)   98.2 F (36.8 C)  TempSrc: Oral   Oral  SpO2: 97% 96% 98% 98%  Weight:      Height:        Physical Exam  Constitutional: She is oriented to person, place, and time.  She appears well-developed and well-nourished. No distress.  HENT:  Head: Normocephalic.  Eyes: Right eye exhibits no discharge. Left eye exhibits no discharge.  Neck: Neck supple.  Cardiovascular: Normal rate, regular rhythm and intact distal pulses.  Pulmonary/Chest: Effort normal and breath sounds normal. No respiratory distress. She has no wheezes. She has no rales.  Abdominal: Soft. Bowel sounds are normal. She exhibits no distension. There is abdominal tenderness. There is guarding. There is no rebound.  Bilateral lower quadrants and periumbilical region tender to palpation with guarding  Musculoskeletal:        General: No edema.  Neurological: She is alert  and oriented to person, place, and time.  Skin: Skin is warm and dry. She is not diaphoretic.  Appears pale     Labs on Admission: I have personally reviewed following labs and imaging studies  CBC: Recent Labs  Lab 08/18/18 1818  WBC 11.1*  HGB 7.0*  HCT 25.6*  MCV 72.1*  PLT 305   Basic Metabolic Panel: Recent Labs  Lab 08/18/18 1818 08/18/18 1920  NA 136  --   K 2.8*  --   CL 101  --   CO2 26  --   GLUCOSE 119*  --   BUN 18  --   CREATININE 0.98  --   CALCIUM 9.7  --   MG  --  1.7   GFR: Estimated Creatinine Clearance: 54.7 mL/min (by C-G formula based on SCr of 0.98 mg/dL). Liver Function Tests: Recent Labs  Lab 08/18/18 1818  AST 14*  ALT 9  ALKPHOS 95  BILITOT 0.5  PROT 7.1  ALBUMIN 3.3*   Recent Labs  Lab 08/18/18 1920  LIPASE 23   No results for input(s): AMMONIA in the last 168 hours. Coagulation Profile: No results for input(s): INR, PROTIME in the last 168 hours. Cardiac Enzymes: No results for input(s): CKTOTAL, CKMB, CKMBINDEX, TROPONINI in the last 168 hours. BNP (last 3 results) No results for input(s): PROBNP in the last 8760 hours. HbA1C: No results for input(s): HGBA1C in the last 72 hours. CBG: No results for input(s): GLUCAP in the last 168 hours. Lipid Profile: No results for input(s): CHOL, HDL, LDLCALC, TRIG, CHOLHDL, LDLDIRECT in the last 72 hours. Thyroid Function Tests: No results for input(s): TSH, T4TOTAL, FREET4, T3FREE, THYROIDAB in the last 72 hours. Anemia Panel: No results for input(s): VITAMINB12, FOLATE, FERRITIN, TIBC, IRON, RETICCTPCT in the last 72 hours. Urine analysis: No results found for: COLORURINE, APPEARANCEUR, LABSPEC, PHURINE, GLUCOSEU, HGBUR, BILIRUBINUR, KETONESUR, PROTEINUR, UROBILINOGEN, NITRITE, LEUKOCYTESUR  Radiological Exams on Admission: Ct Abdomen Pelvis W Contrast  Result Date: 08/18/2018 CLINICAL DATA:  Abdominal pain and GI bleeding EXAM: CT ABDOMEN AND PELVIS WITH CONTRAST TECHNIQUE:  Multidetector CT imaging of the abdomen and pelvis was performed using the standard protocol following bolus administration of intravenous contrast. CONTRAST:  100mL OMNIPAQUE 300 COMPARISON:  None. FINDINGS: Lower chest: Lung bases are free of acute infiltrate or sizable effusion. Hepatobiliary: Liver is well visualized and within normal limits. The gallbladder is decompressed with gallstones within. No pericholecystic fluid is noted. Pancreas: Unremarkable. No pancreatic ductal dilatation or surrounding inflammatory changes. Spleen: Normal in size without focal abnormality. Adrenals/Urinary Tract: Adrenal glands are within normal limits. Kidneys are well visualized bilaterally. No renal calculi or urinary tract obstructive changes are seen. There is a 1.9 cm cyst in the midportion of the right kidney. Delayed images demonstrate normal excretion of contrast material. The bladder is well distended and within normal limits. Stomach/Bowel:  Scattered diverticular change of the colon is noted. Mild inflammatory changes are noted about the cecum and ascending colon. Similar changes are noted in the region of the rectum as well. The appendix is visualized without significant dilatation at the upper limits of normal in size. No significant peri appendiceal inflammatory changes are noted to suggest appendicitis. Small bowel shows no obstructive changes. A large hiatal hernia is noted with the majority of the stomach within the chest cavity. Vascular/Lymphatic: Atherosclerotic calcifications are noted without aneurysmal dilatation or dissection. No significant lymphadenopathy is seen. Reproductive: Uterus has been surgically removed. Other: Minimal free pelvic fluid is noted.  No free air is seen. Musculoskeletal: Degenerative changes of lumbar spine are noted. No compression deformities are seen. Degenerative anterolisthesis of L4 on L5 is noted. IMPRESSION: Scattered areas of inflammatory change involving the colon  primarily in the ascending colon and cecum as well as the rectum suggestive of multifocal colitis. No obstructive changes are seen. No changes of perforation are noted. Upper limits of normal appendix without significant periappendiceal inflammatory change. Diverticulosis without diverticulitis. Cholelithiasis without complicating factors. Electronically Signed   By: Alcide Clever M.D.   On: 08/18/2018 19:48    EKG: Independently reviewed.  Sinus rhythm.  Assessment/Plan Principal Problem:   GI bleed Active Problems:   Hypokalemia   Anemia   GERD (gastroesophageal reflux disease)   HTN (hypertension)   GI bleed, suspect secondary to colitis History of PUD now presenting with complaints of melena for the past 1 day.  Hemodynamically stable.  White count 11.1.  Patient is afebrile.  Hemoglobin 7.0, was 9.0 six months ago.Marland Kitchen  FOBT negative.  Brown stool noted on rectal exam done by ED provider.  CT with scattered areas of inflammatory change involving the colon primarily in the ascending colon and cecum as well as the rectum suggestive of multifocal colitis.  No perforation noted.  Diverticulosis without diverticulitis.  No prior EGD or colonoscopy results in the chart. -ED provider consulted GI who recommended IV Cipro and Flagyl and sending C. difficile and GI pathogen panel.  Orders have been placed.  GI will consult in a.m. -Type and screen -1 unit PRBCs ordered in the ED -IV PPI bolus and infusion ordered in the ED. Will continue at this time. -IV fluid hydration -Repeat CBC in a.m. -Keep n.p.o. Sips with meds okay.  Hypokalemia Potassium 2.8. -Replete potassium.  Check magnesium level.  Continue to monitor BMP.  Acute on chronic anemia, suspect secondary to gastrointestinal blood loss Hemoglobin 7.0, MCV 72.  Hemoglobin was 9.0 six months ago. -Check iron, ferritin, TIBC -Continue to monitor CBC  GERD -PPI  Hypertension -Blood pressure slightly elevated.  Hydralazine PRN SBP  >160.  Unable to safely order home medications at this time as pharmacy medication reconciliation is pending.  DVT prophylaxis: SCDs Code Status: Patient wishes to be DNR. Family Communication: No family available. Disposition Plan: Anticipate discharge after clinical improvement. Consults called: GI (Dr. Marca Ancona) Admission status: Observation, MedSurg  This chart was dictated using voice recognition software.  Despite best efforts to proofread, errors can occur which can change the documentation meaning.  John Giovanni MD [facility] Pager [phone]  If 7PM-7AM, please contact night-coverage [email or web address] Password [facility]  08/18/2018, 9:50 PM

## 2018-08-18 NOTE — ED Notes (Signed)
Patient's daughter updated of admission floor and room.

## 2018-08-18 NOTE — ED Notes (Addendum)
Blood consent in chart. Transfusion stated with Christen Bame, RN at [phone]. Attempted to stick x3 for a second IV unsuccessfully.

## 2018-08-19 DIAGNOSIS — Z888 Allergy status to other drugs, medicaments and biological substances status: Secondary | ICD-10-CM | POA: Diagnosis not present

## 2018-08-19 DIAGNOSIS — K219 Gastro-esophageal reflux disease without esophagitis: Secondary | ICD-10-CM | POA: Diagnosis present

## 2018-08-19 DIAGNOSIS — Z66 Do not resuscitate: Secondary | ICD-10-CM | POA: Diagnosis present

## 2018-08-19 DIAGNOSIS — E039 Hypothyroidism, unspecified: Secondary | ICD-10-CM | POA: Diagnosis present

## 2018-08-19 DIAGNOSIS — T39395A Adverse effect of other nonsteroidal anti-inflammatory drugs [NSAID], initial encounter: Secondary | ICD-10-CM | POA: Diagnosis present

## 2018-08-19 DIAGNOSIS — R64 Cachexia: Secondary | ICD-10-CM | POA: Diagnosis present

## 2018-08-19 DIAGNOSIS — Z8711 Personal history of peptic ulcer disease: Secondary | ICD-10-CM | POA: Diagnosis not present

## 2018-08-19 DIAGNOSIS — M549 Dorsalgia, unspecified: Secondary | ICD-10-CM | POA: Diagnosis present

## 2018-08-19 DIAGNOSIS — Z880 Allergy status to penicillin: Secondary | ICD-10-CM | POA: Diagnosis not present

## 2018-08-19 DIAGNOSIS — Z79890 Hormone replacement therapy (postmenopausal): Secondary | ICD-10-CM | POA: Diagnosis not present

## 2018-08-19 DIAGNOSIS — A0472 Enterocolitis due to Clostridium difficile, not specified as recurrent: Secondary | ICD-10-CM | POA: Diagnosis present

## 2018-08-19 DIAGNOSIS — G8929 Other chronic pain: Secondary | ICD-10-CM | POA: Diagnosis present

## 2018-08-19 DIAGNOSIS — R195 Other fecal abnormalities: Secondary | ICD-10-CM | POA: Diagnosis present

## 2018-08-19 DIAGNOSIS — Z6821 Body mass index (BMI) 21.0-21.9, adult: Secondary | ICD-10-CM | POA: Diagnosis not present

## 2018-08-19 DIAGNOSIS — K922 Gastrointestinal hemorrhage, unspecified: Secondary | ICD-10-CM | POA: Diagnosis not present

## 2018-08-19 DIAGNOSIS — Z87442 Personal history of urinary calculi: Secondary | ICD-10-CM | POA: Diagnosis not present

## 2018-08-19 DIAGNOSIS — E876 Hypokalemia: Secondary | ICD-10-CM | POA: Diagnosis present

## 2018-08-19 DIAGNOSIS — I10 Essential (primary) hypertension: Secondary | ICD-10-CM | POA: Diagnosis present

## 2018-08-19 DIAGNOSIS — Z79899 Other long term (current) drug therapy: Secondary | ICD-10-CM | POA: Diagnosis not present

## 2018-08-19 DIAGNOSIS — M797 Fibromyalgia: Secondary | ICD-10-CM | POA: Diagnosis present

## 2018-08-19 DIAGNOSIS — D509 Iron deficiency anemia, unspecified: Secondary | ICD-10-CM | POA: Diagnosis present

## 2018-08-19 DIAGNOSIS — D649 Anemia, unspecified: Secondary | ICD-10-CM | POA: Diagnosis not present

## 2018-08-19 DIAGNOSIS — G20 Parkinson's disease: Secondary | ICD-10-CM | POA: Diagnosis present

## 2018-08-19 LAB — TYPE AND SCREEN
ABO/RH(D): A POS
Antibody Screen: NEGATIVE
Unit division: 0

## 2018-08-19 LAB — BASIC METABOLIC PANEL
Anion gap: 7 (ref 5–15)
BUN: 13 mg/dL (ref 8–23)
CO2: 23 mmol/L (ref 22–32)
Calcium: 9.2 mg/dL (ref 8.9–10.3)
Chloride: 108 mmol/L (ref 98–111)
Creatinine, Ser: 0.81 mg/dL (ref 0.44–1.00)
GFR calc Af Amer: >60 mL/min (ref >60)
GFR calc non Af Amer: >60 mL/min (ref >60)
Glucose, Bld: 111 mg/dL — ABNORMAL HIGH (ref 70–99)
Potassium: 3.2 mmol/L — ABNORMAL LOW (ref 3.5–5.1)
Sodium: 138 mmol/L (ref 135–145)

## 2018-08-19 LAB — LACTIC ACID, PLASMA: Lactic Acid, Venous: 0.6 mmol/L (ref 0.5–1.9)

## 2018-08-19 LAB — C DIFFICILE QUICK SCREEN W PCR REFLEX??: C Diff toxin: NEGATIVE

## 2018-08-19 LAB — CBC
HCT: 25.7 % — ABNORMAL LOW (ref 36.0–46.0)
Hemoglobin: 7.5 g/dL — ABNORMAL LOW (ref 12.0–15.0)
MCH: 21.1 pg — ABNORMAL LOW (ref 26.0–34.0)
MCHC: 29.2 g/dL — ABNORMAL LOW (ref 30.0–36.0)
MCV: 72.4 fL — ABNORMAL LOW (ref 80.0–100.0)
Platelets: 291 10*3/uL (ref 150–400)
RBC: 3.55 MIL/uL — ABNORMAL LOW (ref 3.87–5.11)
RDW: 16.3 % — ABNORMAL HIGH (ref 11.5–15.5)
WBC: 9.3 10*3/uL (ref 4.0–10.5)
nRBC: 0 % (ref 0.0–0.2)

## 2018-08-19 LAB — HEPATIC FUNCTION PANEL
ALT: 9 U/L (ref 0–44)
AST: 14 U/L — ABNORMAL LOW (ref 15–41)
Albumin: 2.8 g/dL — ABNORMAL LOW (ref 3.5–5.0)
Alkaline Phosphatase: 81 U/L (ref 38–126)
Bilirubin, Direct: 0.1 mg/dL (ref 0.0–0.2)
Indirect Bilirubin: 0.7 mg/dL (ref 0.3–0.9)
Total Bilirubin: 0.8 mg/dL (ref 0.3–1.2)
Total Protein: 6.1 g/dL — ABNORMAL LOW (ref 6.5–8.1)

## 2018-08-19 LAB — BPAM RBC
Blood Product Expiration Date: 202004172359
ISSUE DATE / TIME: 202004122006
Unit Type and Rh: 6200

## 2018-08-19 LAB — C DIFFICILE QUICK SCREEN W PCR REFLEX: C Diff antigen: POSITIVE — AB

## 2018-08-19 LAB — IRON AND TIBC
Iron: 43 ug/dL (ref 28–170)
Saturation Ratios: 14 % (ref 10.4–31.8)
TIBC: 309 ug/dL (ref 250–450)
UIBC: 266 ug/dL

## 2018-08-19 LAB — FERRITIN: Ferritin: 19 ng/mL (ref 11–307)

## 2018-08-19 MED ORDER — TRAMADOL HCL 50 MG PO TABS
ORAL_TABLET | ORAL | Status: AC
  Filled 2018-08-19: qty 1

## 2018-08-19 MED ORDER — POTASSIUM CHLORIDE 10 MEQ/100ML IV SOLN
10.0000 meq | INTRAVENOUS | Status: AC
  Administered 2018-08-19 (×3): 10 meq via INTRAVENOUS
  Filled 2018-08-19 (×3): qty 100

## 2018-08-19 MED ORDER — SODIUM CHLORIDE 0.9 % IV SOLN
INTRAVENOUS | Status: AC
  Administered 2018-08-19: 11:00:00 via INTRAVENOUS

## 2018-08-19 MED ORDER — MORPHINE SULFATE (PF) 2 MG/ML IV SOLN
0.5000 mg | INTRAVENOUS | Status: DC | PRN
  Administered 2018-08-19 – 2018-08-22 (×8): 0.5 mg via INTRAVENOUS
  Filled 2018-08-19 (×8): qty 1

## 2018-08-19 MED ORDER — POTASSIUM CHLORIDE 10 MEQ/50ML IV SOLN: 10.0000 meq | INTRAVENOUS | Status: DC

## 2018-08-19 MED ORDER — TRAMADOL HCL 50 MG PO TABS
50.0000 mg | ORAL_TABLET | Freq: Once | ORAL | Status: AC
  Administered 2018-08-19: 50 mg via ORAL

## 2018-08-19 MED ORDER — LEVOTHYROXINE SODIUM 75 MCG PO TABS
75.0000 ug | ORAL_TABLET | Freq: Every day | ORAL | Status: DC
  Administered 2018-08-20 – 2018-08-25 (×6): 75 ug via ORAL
  Filled 2018-08-19 (×6): qty 1

## 2018-08-19 NOTE — Consult Note (Signed)
Subjective:   HPI  The patient is a 73 year old female who was admitted to the hospital with dark-colored stools. She was also having bilateral lower abdominal dull pains. Denied vomiting blood. Recalls having had a colonoscopy 3 years ago and nothing serious was found. She had a CT scan on admission showing scattered areas of inflammatory changes involving the colon primarily in the ascending colon and cecum as well as rectum. There were also diverticulosis. No evidence of diverticulitis. Patient reports taking NSAIDs for chronic back pain 3 times a day. Remote history of peptic ulcer disease. Review of Systems No chest pain or shortness of breath  Past Medical History:  Diagnosis Date  . Fibromyalgia   . GERD (gastroesophageal reflux disease)   . Hypertension   . Kidney stones 1982   Past Surgical History:  Procedure Laterality Date  . carpel tunnel  2000   right  . NOSE SURGERY  1998   reconstruction  . OVARIAN CYST SURGERY  2011  . ROTATOR CUFF REPAIR Right    Social History   Socioeconomic History  . Marital status: Single    Spouse name: Not on file  . Number of children: 2  . Years of education: 2413  . Highest education level: Not on file  Occupational History  . 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 Financial resource strain: Not on file  . Food insecurity:    Worry: Not on file    Inability: Not on file  . Transportation needs:    Medical: Not on file    Non-medical: Not on file  Tobacco Use  . Smoking status: Never Smoker  . Smokeless tobacco: Never Used  Substance and Sexual Activity  . Alcohol use: No  . Drug use: No  . Sexual activity: Not on file  Lifestyle  . Physical activity:    Days per week: Not on file    Minutes per session: Not on file  . Stress: Not on file  Relationships  . Social connections:    Talks on phone: Not on file    Gets together: Not on file    Attends religious service: Not on file    Active  member of club or organization: Not on file    Attends meetings of clubs or organizations: Not on file    Relationship status: Not on file  . Intimate partner violence:    Fear of current or ex partner: Not on file    Emotionally abused: Not on file    Physically abused: Not on file    Forced sexual activity: Not on file  Other Topics Concern  . Not on file  Social History Narrative   Lives at home alone   Drinks 16 oz tea daily   family history includes Healthy in her father and mother.  Current Facility-Administered Medications:  .  acetaminophen (TYLENOL) tablet 650 mg, 650 mg, Oral, Q6H PRN, 650 mg at 08/19/18 0142 **OR** acetaminophen (TYLENOL) suppository 650 mg, 650 mg, Rectal, Q6H PRN, John Giovanniathore, Vasundhra, MD .  ciprofloxacin (CIPRO) IVPB 400 mg, 400 mg, Intravenous, Q12H, John Giovanniathore, Vasundhra, MD, Last Rate: 200 mL/hr at 08/19/18 0859, 400 mg at 08/19/18 0859 .  hydrALAZINE (APRESOLINE) injection 5 mg, 5 mg, Intravenous, Q4H PRN, John Giovanniathore, Vasundhra, MD .  metroNIDAZOLE (FLAGYL) IVPB 500 mg, 500 mg, Intravenous, Q8H, John Giovanniathore, Vasundhra, MD, Last Rate: 100 mL/hr at 08/19/18 0627, 500 mg at 08/19/18 0627 .  pantoprazole (PROTONIX) 80 mg in sodium chloride  0.9 % 250 mL (0.32 mg/mL) infusion, 8 mg/hr, Intravenous, Continuous, John Giovanni, MD, Last Rate: 25 mL/hr at 08/19/18 0641, 8 mg/hr at 08/19/18 0641 .  potassium chloride 10 mEq in 100 mL IVPB, 10 mEq, Intravenous, Q1 Hr x 3, Samtani, Jai-Gurmukh, MD, Last Rate: 100 mL/hr at 08/19/18 0942, 10 mEq at 08/19/18 0942 Allergies  Allergen Reactions  . Amoxicillin Itching    Did it involve swelling of the face/tongue/throat, SOB, or low BP? Yes Did it involve sudden or severe rash/hives, skin peeling, or any reaction on the inside of your mouth or nose? No Did you need to seek medical attention at a hospital or doctor's office? No When did it last happen?unknown  If all above answers are "NO", may proceed with cephalosporin  use.   . Carbidopa-Levodopa Itching     Objective:     BP (!) 144/74 (BP Location: Right Arm)   Pulse 80   Temp 98.3 F (36.8 C) (Oral)   Resp 18   Ht 5\' 9"  (1.753 m)   Wt 65.8 kg   SpO2 95%   BMI 21.41 kg/m   No distress  Heart regular rhythm no murmurs  Lungs clear.  Abdomen soft , bowel sounds present, some tenderness bilaterally in the lower abdomen but no rebound pain  Laboratory No components found for: D1    Assessment:     Colitis presenting with GI bleeding. Her colitis could be infectious in etiology, could also be ischemic, could also be NSAID induced.      Plan:     C. Difficile pending. GI pathogens panel pending. Empiric Cipro and Flagyl. Follow clinically. No plans for colonoscopy or endoscopy at this time.

## 2018-08-19 NOTE — Progress Notes (Addendum)
[facility] PROGRESS NOTE  Heather Brady [id number] DOB: July 20, 1945 DOA: 08/18/2018 PCP: Lanice Schwab, MD   Narrative: 73 year old Caucasian female History of restless leg syndrome, dizziness Cervical radiculopathy not amenable to surgery per Dr. Venetia Maxon Frequent falls?  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 Arbutus Leas Primary hyperparathyroidism vitamin D disorder followed by Dr. Isidoro Donning through [facility] over the course of 4/12 with bright red stools and then dark red stools over the past 2 days likely secondary to NSAIDs for chronic back pain-notable previous GI bleed about 1995 endoscopy at the time history of ulcers-colonoscopy around 2017 was normal  Hemoglobin down from 9-7 CT showed multifocal colitis Cipro Flagyl started PPI bolus and PRBC X1 started potassium replaced GI Dr. Marca Ancona consulted  A & Plan GI bleed query cause Ischemic?  Infectious?  C. difficile-lactic acid nonspecific but will obtain and if elevated may point to ischemic causes Continue for now broad-spectrum antibiotics No stool since this morning so unlikely to be C. difficile although will check if passes stool today Appreciate GI input IV PPI GTT at this time Saline 125-->50 Repeat CBC a.m. transfusion trigger = 7 Started clear liquids Very cautious pain control morphine 0.5 mg every 4 as needed Cortical basal ganglionic degeneration followed by Dr. Arbutus Leas I had a secure messaging conversation with Dr. Samantha Crimes absolutely supports goals of care in the outpatient setting given very poor overall prognosis Nursing reported patient slightly confused this morning however she is able to clearly state multiple identifiers including her daughter's name and phone number Hypokalemia Replace with protocol Labs a.m. including magnesium Primary hyperparathyroidism Hypothyroidism Calcium is actually normal Continue thyroxine tablet Cachexia BMI 21 N.p.o. at this time No  need for nutrition consult at this time  Lovenox, DNR, inpatient,\ D/w daughter TINA in detail  Earlin Sweeden, MD  [facility] Via [employer] app OR [email or web address] 7PM-7AM contact night coverage as above 08/19/2018, 8:08 AM  LOS: 0 days   Consultants:  GI Dr. Janifer Adie  Procedures:  None yet  Antimicrobials:  Cipro Flagyl  Interval history/Subjective: Awake alert pleasant no distress Slow speech halting at times and slightly slow facial responses but seems to comprehend clearly what I am saying   Objective:  Vitals:  Vitals:   08/18/18 2335 08/19/18 0553  BP: (!) 154/77 (!) 144/74  Pulse: 92 80  Resp: 16 18  Temp: 98.9 F (37.2 C) 98.3 F (36.8 C)  SpO2: 97% 95%    Exam:  Limited range of motion to right arm cannot forward raise secondary to pain in shoulder Left arm power is 5/5 External ocular movements intact S1-S2 no murmur rub or gallop Abdomen soft No lower extremity edema Range of motion lower extremities is intact Chest clinically clear S1-S2 no murmur rub or gallop    I have personally reviewed the following:  DATA   Labs:  Hemoglobin up to 7.5 potassium 3.2 and replacing  Iron 43 saturation ratio 14 ferritin 19 no LFTs    Review and summation of old records:  Yes  Scheduled Meds: Continuous Infusions: . sodium chloride 50 mL/hr at 08/19/18 1115  . ciprofloxacin 400 mg (08/19/18 0859)  . metronidazole 500 mg (08/19/18 0627)  . pantoprozole (PROTONIX) infusion 8 mg/hr (08/19/18 0641)  . potassium chloride 10 mEq (08/19/18 1117)    Principal Problem:   GI bleed Active Problems:   Hypokalemia   Anemia   GERD (gastroesophageal reflux disease)   HTN (hypertension)   LOS: 0 days

## 2018-08-20 LAB — CBC WITH DIFFERENTIAL/PLATELET
Abs Immature Granulocytes: 0.1 10*3/uL — ABNORMAL HIGH (ref 0.00–0.07)
Basophils Absolute: 0 10*3/uL (ref 0.0–0.1)
Basophils Relative: 1 %
Eosinophils Absolute: 0.2 10*3/uL (ref 0.0–0.5)
Eosinophils Relative: 3 %
HCT: 26.3 % — ABNORMAL LOW (ref 36.0–46.0)
Hemoglobin: 7.4 g/dL — ABNORMAL LOW (ref 12.0–15.0)
Immature Granulocytes: 1 %
Lymphocytes Relative: 21 %
Lymphs Abs: 1.5 10*3/uL (ref 0.7–4.0)
MCH: 20.6 pg — ABNORMAL LOW (ref 26.0–34.0)
MCHC: 28.1 g/dL — ABNORMAL LOW (ref 30.0–36.0)
MCV: 73.1 fL — ABNORMAL LOW (ref 80.0–100.0)
Monocytes Absolute: 0.5 10*3/uL (ref 0.1–1.0)
Monocytes Relative: 7 %
Neutro Abs: 4.6 10*3/uL (ref 1.7–7.7)
Neutrophils Relative %: 67 %
Platelets: 309 10*3/uL (ref 150–400)
RBC: 3.6 MIL/uL — ABNORMAL LOW (ref 3.87–5.11)
RDW: 16.8 % — ABNORMAL HIGH (ref 11.5–15.5)
WBC: 7 10*3/uL (ref 4.0–10.5)
nRBC: 0 % (ref 0.0–0.2)

## 2018-08-20 LAB — RENAL FUNCTION PANEL
Albumin: 2.9 g/dL — ABNORMAL LOW (ref 3.5–5.0)
Anion gap: 8 (ref 5–15)
BUN: 5 mg/dL — ABNORMAL LOW (ref 8–23)
CO2: 22 mmol/L (ref 22–32)
Calcium: 9.5 mg/dL (ref 8.9–10.3)
Chloride: 108 mmol/L (ref 98–111)
Creatinine, Ser: 0.84 mg/dL (ref 0.44–1.00)
GFR calc Af Amer: >60 mL/min (ref >60)
GFR calc non Af Amer: >60 mL/min (ref >60)
Glucose, Bld: 111 mg/dL — ABNORMAL HIGH (ref 70–99)
Phosphorus: 2.8 mg/dL (ref 2.5–4.6)
Potassium: 3.1 mmol/L — ABNORMAL LOW (ref 3.5–5.1)
Sodium: 138 mmol/L (ref 135–145)

## 2018-08-20 LAB — GASTROINTESTINAL PANEL BY PCR, STOOL (REPLACES STOOL CULTURE)

## 2018-08-20 LAB — CLOSTRIDIUM DIFFICILE BY PCR, REFLEXED: Toxigenic C. Difficile by PCR: POSITIVE — AB

## 2018-08-20 MED ORDER — POTASSIUM CHLORIDE 2 MEQ/ML IV SOLN
INTRAVENOUS | Status: DC
  Administered 2018-08-20 – 2018-08-25 (×10): via INTRAVENOUS
  Filled 2018-08-20 (×11): qty 1000

## 2018-08-20 MED ORDER — VANCOMYCIN 50 MG/ML ORAL SOLUTION
125.0000 mg | Freq: Four times a day (QID) | ORAL | Status: DC
  Administered 2018-08-20 – 2018-08-25 (×21): 125 mg via ORAL
  Filled 2018-08-20 (×22): qty 2.5

## 2018-08-20 MED ORDER — POTASSIUM CHLORIDE CRYS ER 20 MEQ PO TBCR
40.0000 meq | EXTENDED_RELEASE_TABLET | Freq: Two times a day (BID) | ORAL | Status: DC
  Administered 2018-08-20 – 2018-08-23 (×7): 40 meq via ORAL
  Filled 2018-08-20 (×7): qty 2

## 2018-08-20 MED ORDER — SODIUM CHLORIDE 0.9 % IV SOLN: INTRAVENOUS | Status: DC

## 2018-08-20 MED ORDER — CALCIUM CARBONATE ANTACID 500 MG PO CHEW
1.0000 | CHEWABLE_TABLET | Freq: Two times a day (BID) | ORAL | Status: DC
  Administered 2018-08-20 – 2018-08-25 (×10): 200 mg via ORAL
  Filled 2018-08-20 (×10): qty 1

## 2018-08-20 MED ORDER — KCL-LACTATED RINGERS 20 MEQ/L IV SOLN
INTRAVENOUS | Status: DC
  Filled 2018-08-20: qty 1000

## 2018-08-20 MED ORDER — LIP MEDEX EX OINT
1.0000 "application " | TOPICAL_OINTMENT | CUTANEOUS | Status: DC | PRN
  Administered 2018-08-20: 1 via TOPICAL
  Filled 2018-08-20: qty 7

## 2018-08-20 NOTE — Progress Notes (Signed)
VAST RN consulted for PIV placement. Unable to assess d/t patient care. Will return to assess.

## 2018-08-20 NOTE — Progress Notes (Addendum)
[facility] PROGRESS NOTE  Heather Brady [id number] DOB: 1945/08/03 DOA: 08/18/2018 PCP: Lanice Schwab, MD   Narrative: 73 year old Caucasian female History of restless leg syndrome, dizziness Cervical radiculopathy not amenable to surgery per Dr. Venetia Maxon Frequent falls?  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 Arbutus Leas Primary hyperparathyroidism vitamin D disorder followed by Dr. Isidoro Donning through [facility] ED over the course of 4/12 with bright red stools and then dark red stools over the past 2 days likely secondary to NSAIDs for chronic back pain-notable previous GI bleed about 1995 endoscopy at the time history of ulcers-colonoscopy around 2017 was normal  Hemoglobin down from 9-7 CT showed multifocal colitis Cipro Flagyl started PPI bolus and PRBC X1 started potassium replaced GI Dr. Marca Ancona consulted  A & Plan GI bleed query cause Ischemic?  Infectious?  C. Difficile AG +, PCR+ Empiric Vanc per protocol--nursing unable to qualift caliber and bristol stging of stool Saline 125-->50 Repeat CBC a.m. transfusion trigger = 7 Started clear liquids, graduate to full regular diet maybe in am per GI  morphine 0.5 mg every 4 as needed Cortical basal ganglionic degeneration followed by Dr. Arbutus Leas OP GOC Hypokalemia Replace with kdur 40 bid Primary hyperparathyroidism Hypothyroidism Calcium is actually normal Continue thyroxine tablet Cachexia BMI 21 N.p.o. at this time No need for nutrition consult at this time  Lovenox, DNR, inpatient Long discussion with daughter--was on Abx ~ 1 month ago for a toothache--would consider this as real CDIFF Would empirically  [name], MD  [facility] Via [employer] app OR [email or web address] 7PM-7AM contact night coverage as above 08/20/2018, 11:28 AM  LOS: 1 day   Consultants:  GI Dr. Janifer Adie  Procedures:  None yet  Antimicrobials:  Cipro Flagyl  Interval history/Subjective:  Awake somewhat  coherent--exam limited to some degree by slow cognitions in addition to slow responses  Objective:  Vitals:  Vitals:   08/19/18 2103 08/20/18 0455  BP: (!) 175/76 (!) 159/83  Pulse: 85 87  Resp: 16 16  Temp: 98.4 F (36.9 C) 98.5 F (36.9 C)  SpO2: 98% 97%    Exam:  Limited range of motion to right arm secondary to pain in shoulder Left arm power is 5/5 External ocular movements intact S1-S2 no murmur rub or gallop Abdomen soft No lower extremity edema Range of motion lower extremities is intact Chest clinically clear S1-S2 no murmur rub or gallop    I have personally reviewed the following:  DATA   Labs:  Hemoglobin up to 7.5-->7.4   potassium 3.1  Iron 43 saturation ratio 14 ferritin 19 no LFTs    Review and summation of old records:  Yes  Scheduled Meds: . levothyroxine  75 mcg Oral QAC breakfast  . vancomycin  125 mg Oral QID   Continuous Infusions: . sodium chloride      Principal Problem:   GI bleed Active Problems:   Hypokalemia   Anemia   GERD (gastroesophageal reflux disease)   HTN (hypertension)   LOS: 1 day

## 2018-08-20 NOTE — Progress Notes (Signed)
[facility] Progress Note  Subjective: Patient seen and has no specific complaints.  Labs reviewed. C. Difficile antigen positive. C. Difficile toxin negative. C. Difficile PCR positive., GI pathogens panel negative  Objective: Vital signs in last 24 hours: Temp:  [97.8 F (36.6 C)-98.5 F (36.9 C)] 98.5 F (36.9 C) (04/14 0455) Pulse Rate:  [85-87] 87 (04/14 0455) Resp:  [16-18] 16 (04/14 0455) BP: (146-175)/(76-83) 159/83 (04/14 0455) SpO2:  [97 %-98 %] 97 % (04/14 0455) Weight change:    PE:  Abdomen soft there is some discomfort in lower abdomen but no rebound  Lab Results: Results for orders placed or performed during the hospital encounter of 08/18/18 (from the past 24 hour(s))  Lactic acid, plasma     Status: None   Collection Time: 08/19/18 11:50 AM  Result Value Ref Range   Lactic Acid, Venous 0.6 0.5 - 1.9 mmol/L  CBC with Differential/Platelet     Status: Abnormal   Collection Time: 08/20/18 10:09 AM  Result Value Ref Range   WBC 7.0 4.0 - 10.5 K/uL   RBC 3.60 (L) 3.87 - 5.11 MIL/uL   Hemoglobin 7.4 (L) 12.0 - 15.0 g/dL   HCT 18.8 (L) 41.6 - 60.6 %   MCV 73.1 (L) 80.0 - 100.0 fL   MCH 20.6 (L) 26.0 - 34.0 pg   MCHC 28.1 (L) 30.0 - 36.0 g/dL   RDW 30.1 (H) 60.1 - 09.3 %   Platelets 309 150 - 400 K/uL   nRBC 0.0 0.0 - 0.2 %   Neutrophils Relative % 67 %   Neutro Abs 4.6 1.7 - 7.7 K/uL   Lymphocytes Relative 21 %   Lymphs Abs 1.5 0.7 - 4.0 K/uL   Monocytes Relative 7 %   Monocytes Absolute 0.5 0.1 - 1.0 K/uL   Eosinophils Relative 3 %   Eosinophils Absolute 0.2 0.0 - 0.5 K/uL   Basophils Relative 1 %   Basophils Absolute 0.0 0.0 - 0.1 K/uL   Immature Granulocytes 1 %   Abs Immature Granulocytes 0.10 (H) 0.00 - 0.07 K/uL  Renal function panel     Status: Abnormal   Collection Time: 08/20/18 10:09 AM  Result Value Ref Range   Sodium 138 135 - 145 mmol/L   Potassium 3.1 (L) 3.5 - 5.1 mmol/L   Chloride 108 98 - 111 mmol/L   CO2 22 22 - 32  mmol/L   Glucose, Bld 111 (H) 70 - 99 mg/dL   BUN 5 (L) 8 - 23 mg/dL   Creatinine, Ser 2.35 0.44 - 1.00 mg/dL   Calcium 9.5 8.9 - 57.3 mg/dL   Phosphorus 2.8 2.5 - 4.6 mg/dL   Albumin 2.9 (L) 3.5 - 5.0 g/dL   GFR calc non Af Amer >60 >60 mL/min   GFR calc Af Amer >60 >60 mL/min   Anion gap 8 5 - 15    Studies/Results: No results found.    Assessment: Colitis. Although unclear the labs suggest C. Difficile. This also still could be NSAID induced.  Plan:   I would recommend discontinuing Cipro and Flagyl. I would recommend empirically treating C. Difficile with vancomycin 125 mg by mouth 4 times a day for 10 days since she does have colitis on CT    SAM F Natale Thoma 08/20/2018, 11:14 AM  Pager: [phone] If no answer or after 5 PM call [phone]

## 2018-08-20 NOTE — Progress Notes (Signed)
Initial Nutrition Assessment  INTERVENTION:   Will assess for need of nutritional supplementation following diet advancement  NUTRITION DIAGNOSIS:   Inadequate oral intake related to inability to eat(GI bleed) as evidenced by (NPO/CLD).  GOAL:   Patient will meet greater than or equal to 90% of their needs  MONITOR:   PO intake, Labs, Weight trends, I & O's  REASON FOR ASSESSMENT:   Malnutrition Screening Tool    ASSESSMENT:   73 y.o. female with medical history significant of PUD, GERD, fibromyalgia, hypertension, nephrolithiasis, parkinsonism, gait difficulty presenting to the hospital for evaluation of dark stools.   **RD working remotely**  Pt currently on clear liquids. Was NPO yesterday 4/13. GI following for colitis. Pt with BM today, loose. C.diff is positive per labs. Will assess at follow-up for nutrition supplement needs once diet is advanced.   Per weight records, pt weighed 169 lb in February 2019, wt loss is insignificant for time frame but weight has trended down. Per nursing documentation, pt with moderate edema. Will continue to monitor weight trends.  Medications: K-DUR tablet BID Labs reviewed: Low K Mg/Phos WNL   NUTRITION - FOCUSED PHYSICAL EXAM:  Unable to perform per department requirements to work remotely.  Diet Order:   Diet Order            Diet clear liquid Room service appropriate? Yes; Fluid consistency: Thin  Diet effective now              EDUCATION NEEDS:   No education needs have been identified at this time  Skin:  Skin Assessment: Reviewed RN Assessment  Last BM:  4/14  Height:   Ht Readings from Last 1 Encounters:  08/18/18 5\' 9"  (1.753 m)    Weight:   Wt Readings from Last 1 Encounters:  08/18/18 65.8 kg    Ideal Body Weight:  65.9 kg  BMI:  Body mass index is 21.41 kg/m.  Estimated Nutritional Needs:   Kcal:  1600-1800  Protein:  70-80g  Fluid:  1.8L/day  Tilda Franco, MS, RD, LDN [facility]  Inpatient Clinical Dietitian Pager: [phone] After Hours Pager: [phone]

## 2018-08-20 NOTE — Evaluation (Signed)
Physical Therapy Evaluation Patient Details Name: Heather GrandchildSharon K Garrison MRN: [id number] DOB: 04/09/1946 Today's Date: 08/20/2018   History of Present Illness  73 y/o F with a h/o GERD, fibromyalgia, HTN, kidney stones, parkinsonism, gait difficulty, Cortical basal ganglionic degeneration and admitted with GI bleed, possible cdiff.  Clinical Impression  Pt admitted with above diagnosis. Pt currently with functional limitations due to the deficits listed below (see PT Problem List).  Pt will benefit from skilled PT to increase their independence and safety with mobility to allow discharge to the venue listed below.  Pt requires increased time to answer questions and response improved with yes/no.  Pt assisted with standing utilizing stedy.  Pt requiring increased assist for mobility at this time.  Uncertain of true baseline, pt reports daughter assists with transfers however she is nonambulatory.  Pt will likely require increased assist upon d/c so recommend SNF if assist not available at home.     Follow Up Recommendations SNF;Supervision/Assistance - 24 hour    Equipment Recommendations  Hospital bed    Recommendations for Other Services       Precautions / Restrictions Precautions Precautions: Fall      Mobility  Bed Mobility Overal bed mobility: Needs Assistance Bed Mobility: Supine to Sit;Sit to Supine     Supine to sit: +2 for physical assistance;Total assist Sit to supine: +2 for physical assistance;Total assist      Transfers Overall transfer level: Needs assistance   Transfers: Sit to/from Stand Sit to Stand: Mod assist;From elevated surface;+2 physical assistance         General transfer comment: utilized stedy as pt unable to stand with RW, pt able to hold standing with mod assist for pericare care, pt declined transfer to recliner after pericare and requested return to bed  Ambulation/Gait                Stairs            Wheelchair Mobility     Modified Rankin (Stroke Patients Only)       Balance Overall balance assessment: Needs assistance Sitting-balance support: Single extremity supported;Feet supported Sitting balance-Leahy Scale: Zero   Postural control: Right lateral lean;Posterior lean Standing balance support: Bilateral upper extremity supported Standing balance-Leahy Scale: Zero                               Pertinent Vitals/Pain Pain Assessment: Faces Faces Pain Scale: Hurts even more Pain Location: pt does not state location or severity when questioned Pain Intervention(s): Monitored during session;Repositioned    Home Living Family/patient expects to be discharged to:: Private residence Living Arrangements: Spouse/significant other Available Help at Discharge: Family Type of Home: House           Additional Comments: pt with difficulty communicating, takes longer time to respond to questions    Prior Function Level of Independence: Needs assistance   Gait / Transfers Assistance Needed: pt reports her daughter lifts her to chair for OOB           Hand Dominance        Extremity/Trunk Assessment   Upper Extremity Assessment Upper Extremity Assessment: RUE deficits/detail;Generalized weakness RUE Deficits / Details: reports hx of shoulder injury with decreased use of R arm, able to grip     Lower Extremity Assessment Lower Extremity Assessment: Generalized weakness;RLE deficits/detail RLE Deficits / Details: increased extension spasticity with movement       Communication  Communication: Expressive difficulties  Cognition Arousal/Alertness: Awake/alert   Overall Cognitive Status: No family/caregiver present to determine baseline cognitive functioning                                        General Comments      Exercises     Assessment/Plan    PT Assessment Patient needs continued PT services  PT Problem List Decreased strength;Decreased  mobility;Decreased balance;Decreased knowledge of use of DME;Decreased activity tolerance;Decreased coordination       PT Treatment Interventions DME instruction;Functional mobility training;Balance training;Patient/family education;Therapeutic activities;Stair training;Therapeutic exercise;Neuromuscular re-education;Wheelchair mobility training    PT Goals (Current goals can be found in the Care Plan section)  Acute Rehab PT Goals PT Goal Formulation: With patient Time For Goal Achievement: 09/03/18 Potential to Achieve Goals: Fair    Frequency Min 2X/week   Barriers to discharge        Co-evaluation               AM-PAC PT "6 Clicks" Mobility  Outcome Measure Help needed turning from your back to your side while in a flat bed without using bedrails?: Total Help needed moving from lying on your back to sitting on the side of a flat bed without using bedrails?: Total Help needed moving to and from a bed to a chair (including a wheelchair)?: Total Help needed standing up from a chair using your arms (e.g., wheelchair or bedside chair)?: Total Help needed to walk in hospital room?: Total Help needed climbing 3-5 steps with a railing? : Total 6 Click Score: 6    End of Session Equipment Utilized During Treatment: Gait belt Activity Tolerance: Patient limited by fatigue Patient left: in bed;with call bell/phone within reach;with bed alarm set Nurse Communication: Mobility status PT Visit Diagnosis: Other abnormalities of gait and mobility (R26.89);Other symptoms and signs involving the nervous system (R29.898)    Time: 0174-9449 PT Time Calculation (min) (ACUTE ONLY): 33 min   Charges:   PT Evaluation $PT Eval Moderate Complexity: 1 Mod PT Treatments $Therapeutic Activity: 8-22 mins       Zenovia Jarred, PT, DPT Acute Rehabilitation Services Office: [phone] Pager: [phone]  Sarajane Jews 08/20/2018, 2:14 PM

## 2018-08-21 DIAGNOSIS — D649 Anemia, unspecified: Secondary | ICD-10-CM

## 2018-08-21 DIAGNOSIS — I10 Essential (primary) hypertension: Secondary | ICD-10-CM

## 2018-08-21 DIAGNOSIS — K219 Gastro-esophageal reflux disease without esophagitis: Secondary | ICD-10-CM

## 2018-08-21 DIAGNOSIS — E876 Hypokalemia: Secondary | ICD-10-CM

## 2018-08-21 LAB — CBC WITH DIFFERENTIAL/PLATELET
Abs Immature Granulocytes: 0.13 10*3/uL — ABNORMAL HIGH (ref 0.00–0.07)
Basophils Absolute: 0.1 10*3/uL (ref 0.0–0.1)
Basophils Relative: 1 %
Eosinophils Absolute: 0.2 10*3/uL (ref 0.0–0.5)
Eosinophils Relative: 3 %
HCT: 29.3 % — ABNORMAL LOW (ref 36.0–46.0)
Hemoglobin: 8.3 g/dL — ABNORMAL LOW (ref 12.0–15.0)
Immature Granulocytes: 2 %
Lymphocytes Relative: 20 %
Lymphs Abs: 1.7 10*3/uL (ref 0.7–4.0)
MCH: 20.8 pg — ABNORMAL LOW (ref 26.0–34.0)
MCHC: 28.3 g/dL — ABNORMAL LOW (ref 30.0–36.0)
MCV: 73.3 fL — ABNORMAL LOW (ref 80.0–100.0)
Monocytes Absolute: 0.7 10*3/uL (ref 0.1–1.0)
Monocytes Relative: 9 %
Neutro Abs: 5.6 10*3/uL (ref 1.7–7.7)
Neutrophils Relative %: 65 %
Platelets: 342 10*3/uL (ref 150–400)
RBC: 4 MIL/uL (ref 3.87–5.11)
RDW: 17.1 % — ABNORMAL HIGH (ref 11.5–15.5)
WBC: 8.4 10*3/uL (ref 4.0–10.5)
nRBC: 0 % (ref 0.0–0.2)

## 2018-08-21 LAB — RENAL FUNCTION PANEL
Albumin: 3.2 g/dL — ABNORMAL LOW (ref 3.5–5.0)
Anion gap: 7 (ref 5–15)
BUN: <5 mg/dL — ABNORMAL LOW (ref 8–23)
CO2: 24 mmol/L (ref 22–32)
Calcium: 9.9 mg/dL (ref 8.9–10.3)
Chloride: 107 mmol/L (ref 98–111)
Creatinine, Ser: 0.77 mg/dL (ref 0.44–1.00)
GFR calc Af Amer: >60 mL/min (ref >60)
GFR calc non Af Amer: >60 mL/min (ref >60)
Glucose, Bld: 113 mg/dL — ABNORMAL HIGH (ref 70–99)
Phosphorus: 2.4 mg/dL — ABNORMAL LOW (ref 2.5–4.6)
Potassium: 3.6 mmol/L (ref 3.5–5.1)
Sodium: 138 mmol/L (ref 135–145)

## 2018-08-21 LAB — MAGNESIUM: Magnesium: 1.5 mg/dL — ABNORMAL LOW (ref 1.7–2.4)

## 2018-08-21 MED ORDER — ADULT MULTIVITAMIN W/MINERALS CH
1.0000 | ORAL_TABLET | Freq: Every day | ORAL | Status: DC
  Administered 2018-08-21 – 2018-08-25 (×5): 1 via ORAL
  Filled 2018-08-21 (×5): qty 1

## 2018-08-21 MED ORDER — ENSURE ENLIVE PO LIQD
237.0000 mL | Freq: Two times a day (BID) | ORAL | Status: DC
  Administered 2018-08-21 – 2018-08-24 (×6): 237 mL via ORAL

## 2018-08-21 MED ORDER — MAGNESIUM SULFATE 2 GM/50ML IV SOLN
2.0000 g | Freq: Once | INTRAVENOUS | Status: AC
  Administered 2018-08-21: 14:00:00 2 g via INTRAVENOUS
  Filled 2018-08-21: qty 50

## 2018-08-21 MED ORDER — PROMETHAZINE HCL 25 MG PO TABS: 12.5000 mg | ORAL_TABLET | Freq: Four times a day (QID) | ORAL | Status: DC | PRN

## 2018-08-21 NOTE — Progress Notes (Signed)
PROGRESS NOTE    [name]  ZOX:[id number] DOB: Apr 27, 1946 DOA: 08/18/2018 PCP: Lanice Schwab, MD   Brief Narrative:  HPI On 08/18/2018 by Dr. John Giovanni Heather Brady is a 73 y.o. female with medical history significant of PUD, GERD, fibromyalgia, hypertension, nephrolithiasis, parkinsonism, gait difficulty presenting to the hospital for evaluation of dark stools.  Patient states since this morning she has had 3 episodes of dark black-colored stools.  Denies noticing any bright red blood in her stool.  Reports bilateral lower quadrant constant dull pain.  She takes NSAIDs for chronic back pain regularly 3 times a day.  States she had a GI bleed previously 25 years ago and had an endoscopy at that time.  Reports history of ulcers.  States she had colonoscopy done 3 years ago and was told it was clear.  Denies any lightheadedness, chest pain, or shortness of breath.  Denies any fevers, chills, cough, or dysuria.  Assessment & Plan   Colitis, C. Difficile versus NSAID -Patient was recently on antibiotics approximately 1 month ago -Patient was having several dark-colored stools prior to admission -She was recently on NSAIDs for chronic back pain -C. difficile PCR positive -Gastroenterology consulted and appreciated-continue to treat C. difficile with oral vancomycin.  This could also be related to NSAID induced or ischemic colitis -Continue oral vancomycin  Cortical basal ganglionic degeneration -Followed outpatient by Dr. Arbutus Leas  Hypokalemia -Follow-up with repletion, continue to monitor BMP  Hypomagnesemia -We will replace and continue to monitor magnesium level  Acute on chronic microcytic anemia -Hemoglobin currently 8.3, continue to monitor BC  Hypothyroidism -Continue synthroid   Cachexia -Currently on soft diet -Continue feeding supplementation and multivitamin  Conditioning -PT recommending SNF  DVT Prophylaxis  Lovenox  Code Status: DNR  Family  Communication: None at bedside  Disposition Plan: Admitted. Pending improvement. Poss discharge to SNF in 1-2 days.  Consultants Gastroenterology   Procedures  None  Antibiotics   Anti-infectives (From admission, onward)   Start     Dose/Rate Route Frequency Ordered Stop   08/20/18 1400  vancomycin (VANCOCIN) 50 mg/mL oral solution 125 mg     125 mg Oral 4 times daily 08/20/18 1151 08/30/18 1359   08/19/18 0800  ciprofloxacin (CIPRO) IVPB 400 mg  Status:  Discontinued     400 mg 200 mL/hr over 60 Minutes Intravenous Every 12 hours 08/18/18 2122 08/20/18 1120   08/19/18 0600  metroNIDAZOLE (FLAGYL) IVPB 500 mg  Status:  Discontinued     500 mg 100 mL/hr over 60 Minutes Intravenous Every 8 hours 08/18/18 2122 08/20/18 1120   08/18/18 2230  metroNIDAZOLE (FLAGYL) IVPB 500 mg     500 mg 100 mL/hr over 60 Minutes Intravenous  Once 08/18/18 2210 08/19/18 0011   08/18/18 2030  ciprofloxacin (CIPRO) IVPB 400 mg     400 mg 200 mL/hr over 60 Minutes Intravenous  Once 08/18/18 2027 08/18/18 2259   08/18/18 2030  metroNIDAZOLE (FLAGYL) IVPB 500 mg  Status:  Discontinued     500 mg 100 mL/hr over 60 Minutes Intravenous  Once 08/18/18 2027 08/19/18 0012      Subjective:   Rocquel Askren seen and examined today.  No complaints this morning.  Wishes to be left alone.  Denies chest pain or shortness of breath, nausea or vomiting. Objective:   Vitals:   08/20/18 2225 08/21/18 0655 08/21/18 1030 08/21/18 1034  BP: (!) 160/85 (!) 168/88  (!) 188/92  Pulse:  95  86  Resp:  18    Temp:  99.5 F (37.5 C) 98.5 F (36.9 C)   TempSrc:  Oral Axillary   SpO2: 97% 96%    Weight:      Height:        Intake/Output Summary (Last 24 hours) at 08/21/2018 1255 Last data filed at 08/21/2018 0600 Gross per 24 hour  Intake 1191.33 ml  Output 2100 ml  Net -908.67 ml   Filed Weights   08/18/18 1812  Weight: 65.8 kg    Exam  General: Well developed, chronically ill-appearing, cachectic, NAD   HEENT: NCAT, mucous membranes moist.   Neck: Supple  Cardiovascular: S1 S2 auscultated, RRR  Respiratory: Clear to auscultation bilaterally with equal chest rise  Abdomen: Soft, nontender, nondistended, + bowel sounds  Extremities: warm dry without cyanosis clubbing or edema  Neuro: AAOx3, slow to respond, nonfocal  Psych: Flat   Data Reviewed: I have personally reviewed following labs and imaging studies  CBC: Recent Labs  Lab 08/18/18 1818 08/19/18 0256 08/20/18 1009 08/21/18 0442  WBC 11.1* 9.3 7.0 8.4  NEUTROABS  --   --  4.6 5.6  HGB 7.0* 7.5* 7.4* 8.3*  HCT 25.6* 25.7* 26.3* 29.3*  MCV 72.1* 72.4* 73.1* 73.3*  PLT 305 291 309 342   Basic Metabolic Panel: Recent Labs  Lab 08/18/18 1818 08/18/18 1920 08/19/18 0256 08/20/18 1009 08/21/18 0442  NA 136  --  138 138 138  K 2.8*  --  3.2* 3.1* 3.6  CL 101  --  108 108 107  CO2 26  --  23 22 24   GLUCOSE 119*  --  111* 111* 113*  BUN 18  --  13 5* <5*  CREATININE 0.98  --  0.81 0.84 0.77  CALCIUM 9.7  --  9.2 9.5 9.9  MG  --  1.7  --   --  1.5*  PHOS  --   --   --  2.8 2.4*   GFR: Estimated Creatinine Clearance: 67 mL/min (by C-G formula based on SCr of 0.77 mg/dL). Liver Function Tests: Recent Labs  Lab 08/18/18 1818 08/19/18 0256 08/20/18 1009 08/21/18 0442  AST 14* 14*  --   --   ALT 9 9  --   --   ALKPHOS 95 81  --   --   BILITOT 0.5 0.8  --   --   PROT 7.1 6.1*  --   --   ALBUMIN 3.3* 2.8* 2.9* 3.2*   Recent Labs  Lab 08/18/18 1920  LIPASE 23   No results for input(s): AMMONIA in the last 168 hours. Coagulation Profile: No results for input(s): INR, PROTIME in the last 168 hours. Cardiac Enzymes: No results for input(s): CKTOTAL, CKMB, CKMBINDEX, TROPONINI in the last 168 hours. BNP (last 3 results) No results for input(s): PROBNP in the last 8760 hours. HbA1C: No results for input(s): HGBA1C in the last 72 hours. CBG: No results for input(s): GLUCAP in the last 168 hours. Lipid  Profile: No results for input(s): CHOL, HDL, LDLCALC, TRIG, CHOLHDL, LDLDIRECT in the last 72 hours. Thyroid Function Tests: No results for input(s): TSH, T4TOTAL, FREET4, T3FREE, THYROIDAB in the last 72 hours. Anemia Panel: Recent Labs    08/19/18 0256  FERRITIN 19  TIBC 309  IRON 43   Urine analysis: No results found for: COLORURINE, APPEARANCEUR, LABSPEC, PHURINE, GLUCOSEU, HGBUR, BILIRUBINUR, KETONESUR, PROTEINUR, UROBILINOGEN, NITRITE, LEUKOCYTESUR Sepsis Labs: @LABRCNTIP (procalcitonin:4,lacticidven:4)  ) Recent Results (from the past 240 hour(s))  Gastrointestinal Panel by PCR ,  Stool     Status: None   Collection Time: 08/18/18  8:27 PM  Result Value Ref Range Status   Campylobacter species NOT DETECTED NOT DETECTED Final   Plesimonas shigelloides NOT DETECTED NOT DETECTED Final   Salmonella species NOT DETECTED NOT DETECTED Final   Yersinia enterocolitica NOT DETECTED NOT DETECTED Final   Vibrio species NOT DETECTED NOT DETECTED Final   Vibrio cholerae NOT DETECTED NOT DETECTED Final   Enteroaggregative E coli (EAEC) NOT DETECTED NOT DETECTED Final   Enteropathogenic E coli (EPEC) NOT DETECTED NOT DETECTED Final   Enterotoxigenic E coli (ETEC) NOT DETECTED NOT DETECTED Final   Shiga like toxin producing E coli (STEC) NOT DETECTED NOT DETECTED Final   Shigella/Enteroinvasive E coli (EIEC) NOT DETECTED NOT DETECTED Final   Cryptosporidium NOT DETECTED NOT DETECTED Final   Cyclospora cayetanensis NOT DETECTED NOT DETECTED Final   Entamoeba histolytica NOT DETECTED NOT DETECTED Final   Giardia lamblia NOT DETECTED NOT DETECTED Final   Adenovirus F40/41 NOT DETECTED NOT DETECTED Final   Astrovirus NOT DETECTED NOT DETECTED Final   Norovirus GI/GII NOT DETECTED NOT DETECTED Final   Rotavirus A NOT DETECTED NOT DETECTED Final   Sapovirus (I, II, IV, and V) NOT DETECTED NOT DETECTED Final    Comment: Performed at [facility], [address] [place], [place]  [id number]  C difficile quick scan w PCR reflex     Status: Abnormal   Collection Time: 08/18/18  8:27 PM  Result Value Ref Range Status   C Diff antigen POSITIVE (A) NEGATIVE Final   C Diff toxin NEGATIVE NEGATIVE Final   C Diff interpretation Results are indeterminate. See PCR results.  Final    Comment: Performed at [facility], [address] [place], [place] [address]  C. Diff by PCR, Reflexed     Status: Abnormal   Collection Time: 08/18/18  8:27 PM  Result Value Ref Range Status   Toxigenic C. Difficile by PCR POSITIVE (A) NEGATIVE Final    Comment: Positive for toxigenic C. difficile with little to no toxin production. Only treat if clinical presentation suggests symptomatic illness. Performed at [facility], [address] [place], [place] [address]       Radiology Studies: No results found.   Scheduled Meds: . calcium carbonate  1 tablet Oral BID  . feeding supplement (ENSURE ENLIVE)  237 mL Oral BID BM  . levothyroxine  75 mcg Oral QAC breakfast  . multivitamin with minerals  1 tablet Oral Daily  . potassium chloride  40 mEq Oral BID  . vancomycin  125 mg Oral QID   Continuous Infusions: . lactated ringers with kcl 75 mL/hr at 08/21/18 0525     LOS: 2 days   Time Spent in minutes   30 minutes  Antawn Sison D.O. on 08/21/2018 at 12:55 PM  Between 7am to 7pm - Please see pager noted on [email or web address]  After 7pm go to [email or web address]  And look for the night coverage person covering for me after hours  [facility] Office  [phone]

## 2018-08-21 NOTE — Progress Notes (Signed)
Nutrition Follow-up  INTERVENTION:   -Provide Ensure Enlive po BID, each supplement provides 350 kcal and 20 grams of protein -Provide Multivitamin with minerals daily  NUTRITION DIAGNOSIS:   Inadequate oral intake related to inability to eat(GI bleed) as evidenced by (NPO/CLD).  Now on soft diet.  GOAL:   Patient will meet greater than or equal to 90% of their needs  Progressing.  MONITOR:   PO intake, Supplement acceptance, Labs, Weight trends, I & O's  ASSESSMENT:   73 y.o. female with medical history significant of PUD, GERD, fibromyalgia, hypertension, nephrolithiasis, parkinsonism, gait difficulty presenting to the hospital for evaluation of dark stools.   **RD working [employer] advanced last night to soft diet. Pt consumed 50% of dinner which consisted of soup, tilapia, carrots, rice, and fruit (~375 kcal, 17g protein consumed). Pt would likely benefit from nutritional supplements, will order Ensure and daily MVI d/t c.diff.   Medications: Calcium carbonate BID, K-DUR tablet BID, Lactated Ringers infusion Labs reviewed: Low Mg/Phos  Diet Order:   Diet Order            DIET SOFT Room service appropriate? Yes; Fluid consistency: Thin  Diet effective now              EDUCATION NEEDS:   No education needs have been identified at this time  Skin:  Skin Assessment: Reviewed RN Assessment  Last BM:  4/14  Height:   Ht Readings from Last 1 Encounters:  08/18/18 5\' 9"  (1.753 m)    Weight:   Wt Readings from Last 1 Encounters:  08/18/18 65.8 kg    Ideal Body Weight:  65.9 kg  BMI:  Body mass index is 21.41 kg/m.  Estimated Nutritional Needs:   Kcal:  1600-1800  Protein:  70-80g  Fluid:  1.8L/day  Tilda Franco, MS, RD, LDN [facility] Inpatient Clinical Dietitian Pager: [phone] After Hours Pager: [phone]

## 2018-08-21 NOTE — Progress Notes (Signed)
Patient was diarrhea today. Being treated for colitis. Could be C. Difficile and is on treatment with vancomycin at this time. Also could be NSAID induced or ischemic. Continue current treatment. Nothing further to add at this time. Advance diet as tolerated.

## 2018-08-22 LAB — HEMOGLOBIN AND HEMATOCRIT, BLOOD
HCT: 30.9 % — ABNORMAL LOW (ref 36.0–46.0)
HCT: 32.2 % — ABNORMAL LOW (ref 36.0–46.0)
Hemoglobin: 8.6 g/dL — ABNORMAL LOW (ref 12.0–15.0)
Hemoglobin: 8.9 g/dL — ABNORMAL LOW (ref 12.0–15.0)

## 2018-08-22 LAB — BASIC METABOLIC PANEL
Anion gap: 8 (ref 5–15)
BUN: 6 mg/dL — ABNORMAL LOW (ref 8–23)
CO2: 22 mmol/L (ref 22–32)
Calcium: 10.1 mg/dL (ref 8.9–10.3)
Chloride: 108 mmol/L (ref 98–111)
Creatinine, Ser: 0.75 mg/dL (ref 0.44–1.00)
GFR calc Af Amer: >60 mL/min (ref >60)
GFR calc non Af Amer: >60 mL/min (ref >60)
Glucose, Bld: 112 mg/dL — ABNORMAL HIGH (ref 70–99)
Potassium: 4.8 mmol/L (ref 3.5–5.1)
Sodium: 138 mmol/L (ref 135–145)

## 2018-08-22 LAB — MAGNESIUM: Magnesium: 2 mg/dL (ref 1.7–2.4)

## 2018-08-22 MED ORDER — MORPHINE SULFATE (PF) 2 MG/ML IV SOLN
0.5000 mg | INTRAVENOUS | Status: DC | PRN
  Administered 2018-08-22 – 2018-08-25 (×6): 0.5 mg via INTRAVENOUS
  Filled 2018-08-22 (×6): qty 1

## 2018-08-22 NOTE — Progress Notes (Signed)
PROGRESS NOTE    Heather Brady  UJW:[id number] DOB: [birth year] DOA: 08/18/2018 PCP: Lanice Schwab, MD   Brief Narrative:  HPI On 08/18/2018 by Dr. John Giovanni Heather Brady is a 73 y.o. female with medical history significant of PUD, GERD, fibromyalgia, hypertension, nephrolithiasis, parkinsonism, gait difficulty presenting to the hospital for evaluation of dark stools.  Patient states since this morning she has had 3 episodes of dark black-colored stools.  Denies noticing any bright red blood in her stool.  Reports bilateral lower quadrant constant dull pain.  She takes NSAIDs for chronic back pain regularly 3 times a day.  States she had a GI bleed previously 25 years ago and had an endoscopy at that time.  Reports history of ulcers.  States she had colonoscopy done 3 years ago and was told it was clear.  Denies any lightheadedness, chest pain, or shortness of breath.  Denies any fevers, chills, cough, or dysuria.  Interim history Admitted with colitis. Found to be positive for C difficile and started on oral vancomycin.  Assessment & Plan   Colitis, C. Difficile versus NSAID -Patient was recently on antibiotics approximately 1 month ago -Patient was having several dark-colored stools prior to admission -She was recently on NSAIDs for chronic back pain -C. difficile PCR positive -Gastroenterology consulted and appreciated-continue to treat C. difficile with oral vancomycin.  This could also be related to NSAID induced or ischemic colitis -Continue oral vancomycin -continue pain control  Cortical basal ganglionic degeneration -Followed outpatient by Dr. Arbutus Leas, neurologist  Hypokalemia -resolved with replacement -continue to monitor   Hypomagnesemia -Magnesium replaced -continue to monitor   Acute on chronic microcytic anemia -Hemoglobin currently 8.6, continue to monitor CBC  Hypothyroidism -Continue synthroid   Cachexia -Currently on soft diet -Continue  feeding supplementation and multivitamin  Conditioning -PT recommending SNF  DVT Prophylaxis  Lovenox  Code Status: DNR  Family Communication: None at bedside. Discussed with daughter via phone.  Disposition Plan: Admitted. Pending improvement. Poss discharge to SNF in a few days.  Consultants Gastroenterology   Procedures  None  Antibiotics   Anti-infectives (From admission, onward)   Start     Dose/Rate Route Frequency Ordered Stop   08/20/18 1400  vancomycin (VANCOCIN) 50 mg/mL oral solution 125 mg     125 mg Oral 4 times daily 08/20/18 1151 08/30/18 1359   08/19/18 0800  ciprofloxacin (CIPRO) IVPB 400 mg  Status:  Discontinued     400 mg 200 mL/hr over 60 Minutes Intravenous Every 12 hours 08/18/18 2122 08/20/18 1120   08/19/18 0600  metroNIDAZOLE (FLAGYL) IVPB 500 mg  Status:  Discontinued     500 mg 100 mL/hr over 60 Minutes Intravenous Every 8 hours 08/18/18 2122 08/20/18 1120   08/18/18 2230  metroNIDAZOLE (FLAGYL) IVPB 500 mg     500 mg 100 mL/hr over 60 Minutes Intravenous  Once 08/18/18 2210 08/19/18 0011   08/18/18 2030  ciprofloxacin (CIPRO) IVPB 400 mg     400 mg 200 mL/hr over 60 Minutes Intravenous  Once 08/18/18 2027 08/18/18 2259   08/18/18 2030  metroNIDAZOLE (FLAGYL) IVPB 500 mg  Status:  Discontinued     500 mg 100 mL/hr over 60 Minutes Intravenous  Once 08/18/18 2027 08/19/18 0012      Subjective:   Heather Brady seen and examined today.  Complains of pain, but then states she has none. Denies chest pain, shortness of breath, nausea, vomiting, headache, dizziness. Denies diarrhea.  Objective:   Vitals:  08/21/18 1829 08/21/18 2111 08/22/18 0459 08/22/18 0621  BP:  (!) 167/86 (!) 176/85 (!) 151/75  Pulse:  95 89 100  Resp:  (!) 24 (!) 24 16  Temp: 98.6 F (37 C) 98.7 F (37.1 C) 98.8 F (37.1 C) 98.9 F (37.2 C)  TempSrc: Axillary Oral Oral Oral  SpO2:  98% 97% 97%  Weight:      Height:        Intake/Output Summary (Last 24 hours)  at 08/22/2018 0929 Last data filed at 08/22/2018 0847 Gross per 24 hour  Intake 2647.41 ml  Output 1825 ml  Net 822.41 ml   Filed Weights   08/18/18 1812  Weight: 65.8 kg   Exam  General: Well developed, chronically ill appearing, cachectic, NAD  HEENT: NCAT,  mucous membranes moist.   Neck: Supple  Cardiovascular: S1 S2 auscultated, RRR  Respiratory: Clear to auscultation bilaterally  Abdomen: Soft, nontender, nondistended, + bowel sounds  Extremities: warm dry without cyanosis clubbing or edema  Neuro: AAOx3, slow to respond, nonfocal  Psych: Flat, however appropriate   Data Reviewed: I have personally reviewed following labs and imaging studies  CBC: Recent Labs  Lab 08/18/18 1818 08/19/18 0256 08/20/18 1009 08/21/18 0442 08/22/18 0338  WBC 11.1* 9.3 7.0 8.4  --   NEUTROABS  --   --  4.6 5.6  --   HGB 7.0* 7.5* 7.4* 8.3* 8.6*  HCT 25.6* 25.7* 26.3* 29.3* 30.9*  MCV 72.1* 72.4* 73.1* 73.3*  --   PLT 305 291 309 342  --    Basic Metabolic Panel: Recent Labs  Lab 08/18/18 1818 08/18/18 1920 08/19/18 0256 08/20/18 1009 08/21/18 0442 08/22/18 0338  NA 136  --  138 138 138 138  K 2.8*  --  3.2* 3.1* 3.6 4.8  CL 101  --  108 108 107 108  CO2 26  --  23 22 24 22   GLUCOSE 119*  --  111* 111* 113* 112*  BUN 18  --  13 5* <5* 6*  CREATININE 0.98  --  0.81 0.84 0.77 0.75  CALCIUM 9.7  --  9.2 9.5 9.9 10.1  MG  --  1.7  --   --  1.5* 2.0  PHOS  --   --   --  2.8 2.4*  --    GFR: Estimated Creatinine Clearance: 67 mL/min (by C-G formula based on SCr of 0.75 mg/dL). Liver Function Tests: Recent Labs  Lab 08/18/18 1818 08/19/18 0256 08/20/18 1009 08/21/18 0442  AST 14* 14*  --   --   ALT 9 9  --   --   ALKPHOS 95 81  --   --   BILITOT 0.5 0.8  --   --   PROT 7.1 6.1*  --   --   ALBUMIN 3.3* 2.8* 2.9* 3.2*   Recent Labs  Lab 08/18/18 1920  LIPASE 23   No results for input(s): AMMONIA in the last 168 hours. Coagulation Profile: No results for  input(s): INR, PROTIME in the last 168 hours. Cardiac Enzymes: No results for input(s): CKTOTAL, CKMB, CKMBINDEX, TROPONINI in the last 168 hours. BNP (last 3 results) No results for input(s): PROBNP in the last 8760 hours. HbA1C: No results for input(s): HGBA1C in the last 72 hours. CBG: No results for input(s): GLUCAP in the last 168 hours. Lipid Profile: No results for input(s): CHOL, HDL, LDLCALC, TRIG, CHOLHDL, LDLDIRECT in the last 72 hours. Thyroid Function Tests: No results for input(s): TSH, T4TOTAL, FREET4,  T3FREE, THYROIDAB in the last 72 hours. Anemia Panel: No results for input(s): VITAMINB12, FOLATE, FERRITIN, TIBC, IRON, RETICCTPCT in the last 72 hours. Urine analysis: No results found for: COLORURINE, APPEARANCEUR, LABSPEC, PHURINE, GLUCOSEU, HGBUR, BILIRUBINUR, KETONESUR, PROTEINUR, UROBILINOGEN, NITRITE, LEUKOCYTESUR Sepsis Labs: @LABRCNTIP (procalcitonin:4,lacticidven:4)  ) Recent Results (from the past 240 hour(s))  Gastrointestinal Panel by PCR , Stool     Status: None   Collection Time: 08/18/18  8:27 PM  Result Value Ref Range Status   Campylobacter species NOT DETECTED NOT DETECTED Final   Plesimonas shigelloides NOT DETECTED NOT DETECTED Final   Salmonella species NOT DETECTED NOT DETECTED Final   Yersinia enterocolitica NOT DETECTED NOT DETECTED Final   Vibrio species NOT DETECTED NOT DETECTED Final   Vibrio cholerae NOT DETECTED NOT DETECTED Final   Enteroaggregative E coli (EAEC) NOT DETECTED NOT DETECTED Final   Enteropathogenic E coli (EPEC) NOT DETECTED NOT DETECTED Final   Enterotoxigenic E coli (ETEC) NOT DETECTED NOT DETECTED Final   Shiga like toxin producing E coli (STEC) NOT DETECTED NOT DETECTED Final   Shigella/Enteroinvasive E coli (EIEC) NOT DETECTED NOT DETECTED Final   Cryptosporidium NOT DETECTED NOT DETECTED Final   Cyclospora cayetanensis NOT DETECTED NOT DETECTED Final   Entamoeba histolytica NOT DETECTED NOT DETECTED Final    Giardia lamblia NOT DETECTED NOT DETECTED Final   Adenovirus F40/41 NOT DETECTED NOT DETECTED Final   Astrovirus NOT DETECTED NOT DETECTED Final   Norovirus GI/GII NOT DETECTED NOT DETECTED Final   Rotavirus A NOT DETECTED NOT DETECTED Final   Sapovirus (I, II, IV, and V) NOT DETECTED NOT DETECTED Final    Comment: Performed at [facility], [address] [place], [place] [address]  C difficile quick scan w PCR reflex     Status: Abnormal   Collection Time: 08/18/18  8:27 PM  Result Value Ref Range Status   C Diff antigen POSITIVE (A) NEGATIVE Final   C Diff toxin NEGATIVE NEGATIVE Final   C Diff interpretation Results are indeterminate. See PCR results.  Final    Comment: Performed at [facility], [address], [place], [place] [address]  C. Diff by PCR, Reflexed     Status: Abnormal   Collection Time: 08/18/18  8:27 PM  Result Value Ref Range Status   Toxigenic C. Difficile by PCR POSITIVE (A) NEGATIVE Final    Comment: Positive for toxigenic C. difficile with little to no toxin production. Only treat if clinical presentation suggests symptomatic illness. Performed at [facility], [address] [place], [place] [address]       Radiology Studies: No results found.   Scheduled Meds:  calcium carbonate  1 tablet Oral BID   feeding supplement (ENSURE ENLIVE)  237 mL Oral BID BM   levothyroxine  75 mcg Oral QAC breakfast   multivitamin with minerals  1 tablet Oral Daily   potassium chloride  40 mEq Oral BID   vancomycin  125 mg Oral QID   Continuous Infusions:  lactated ringers with kcl 75 mL/hr at 08/22/18 0725     LOS: 3 days   Time Spent in minutes   30 minutes  Heather Brady D.O. on 08/22/2018 at 9:29 AM  Between 7am to 7pm - Please see pager noted on [email or web address]  After 7pm go to [email or web address]  And look for the night coverage person covering for me after hours  [facility] Office  [phone]

## 2018-08-22 NOTE — Progress Notes (Signed)
The nurse reported that the patient had a soft/loose bowel movement this morning. No blood. We are treating her with vancomycin for possible C. Difficile colitis. This colitis could also be ischemic or NSAID induced however. Nothing further to add at this point. Continue current medical treatment. Call us if needed.

## 2018-08-22 NOTE — Care Management Important Message (Signed)
Important Message  Patient Details IM Letter given to Case Manager to present to the Patient Name: Heather Brady MRN: [id number] Date of Birth: 10-Feb-1946   Medicare Important Message Given:  Yes    Caren Macadam 08/22/2018, 1:04 PMImportant Message  Patient Details  Name: Heather Brady MRN: [id number] Date of Birth: [birth year]   Medicare Important Message Given:  Yes    Caren Macadam 08/22/2018, 1:04 PM

## 2018-08-22 NOTE — Progress Notes (Signed)
Physical Therapy Treatment Patient Details Name: Heather Brady MRN: [id number] DOB: Sep 23, 1945 Today's Date: 08/22/2018    History of Present Illness 73 y/o F with a h/o GERD, fibromyalgia, HTN, kidney stones, parkinsonism, gait difficulty, Cortical basal ganglionic degeneration and admitted with GI bleed, possible cdiff.    PT Comments    Pt cooperative but moaning/groaning with movement (hurts all over) and with ltd progress.  Pt showing slight improvement in activity tolerance and EOB sitting balance but standing requires significant assist of two and ambulation is not possible at this time.  This session ltd by onset of bowel incontinence.   Follow Up Recommendations  SNF;Supervision/Assistance - 24 hour     Equipment Recommendations  Hospital bed    Recommendations for Other Services       Precautions / Restrictions Precautions Precautions: Fall Restrictions Weight Bearing Restrictions: No    Mobility  Bed Mobility Overal bed mobility: Needs Assistance Bed Mobility: Supine to Sit;Sit to Supine     Supine to sit: +2 for physical assistance;Max assist Sit to supine: +2 for physical assistance;Total assist      Transfers Overall transfer level: Needs assistance   Transfers: Sit to/from Stand Sit to Stand: Mod assist;From elevated surface;+2 physical assistance         General transfer comment: Pt stood x 2 with RW but wtih limited endurance  and significant assist to achieve and maintain semi-erect posture  Ambulation/Gait                 Stairs             Wheelchair Mobility    Modified Rankin (Stroke Patients Only)       Balance Overall balance assessment: Needs assistance Sitting-balance support: Feet supported;No upper extremity supported Sitting balance-Leahy Scale: Poor Sitting balance - Comments: sitting x 5 minutes with instability in all directions but able to hold balance unassisted for brief periods   Standing balance  support: Bilateral upper extremity supported Standing balance-Leahy Scale: Zero                              Cognition Arousal/Alertness: Awake/alert   Overall Cognitive Status: No family/caregiver present to determine baseline cognitive functioning                                        Exercises      General Comments        Pertinent Vitals/Pain Pain Assessment: Faces Faces Pain Scale: Hurts even more Pain Location: pt does not state location or severity when questioned Pain Intervention(s): Monitored during session;Repositioned    Home Living                      Prior Function            PT Goals (current goals can now be found in the care plan section) Acute Rehab PT Goals PT Goal Formulation: With patient Time For Goal Achievement: 09/03/18 Potential to Achieve Goals: Fair Progress towards PT goals: Progressing toward goals    Frequency    Min 2X/week      PT Plan Current plan remains appropriate    Co-evaluation              AM-PAC PT "6 Clicks" Mobility   Outcome Measure  Help needed turning from your  back to your side while in a flat bed without using bedrails?: A Lot Help needed moving from lying on your back to sitting on the side of a flat bed without using bedrails?: A Lot Help needed moving to and from a bed to a chair (including a wheelchair)?: Total Help needed standing up from a chair using your arms (e.g., wheelchair or bedside chair)?: A Lot Help needed to walk in hospital room?: Total Help needed climbing 3-5 steps with a railing? : Total 6 Click Score: 9    End of Session Equipment Utilized During Treatment: Gait belt Activity Tolerance: Patient limited by fatigue Patient left: in bed;with call bell/phone within reach;with bed alarm set;with nursing/sitter in room Nurse Communication: Mobility status PT Visit Diagnosis: Other abnormalities of gait and mobility (R26.89);Other symptoms and  signs involving the nervous system (R29.898)     Time: 1123-1150 PT Time Calculation (min) (ACUTE ONLY): 27 min  Charges:  $Therapeutic Activity: 23-37 mins                     Mauro Kaufmann PT Acute Rehabilitation Services Pager [phone] Office [phone]    Heather Brady 08/22/2018, 12:26 PM

## 2018-08-23 LAB — HEMOGLOBIN AND HEMATOCRIT, BLOOD
HCT: 30.9 % — ABNORMAL LOW (ref 36.0–46.0)
Hemoglobin: 8.7 g/dL — ABNORMAL LOW (ref 12.0–15.0)

## 2018-08-23 LAB — BASIC METABOLIC PANEL
Anion gap: 8 (ref 5–15)
BUN: 7 mg/dL — ABNORMAL LOW (ref 8–23)
CO2: 21 mmol/L — ABNORMAL LOW (ref 22–32)
Calcium: 10.5 mg/dL — ABNORMAL HIGH (ref 8.9–10.3)
Chloride: 106 mmol/L (ref 98–111)
Creatinine, Ser: 0.83 mg/dL (ref 0.44–1.00)
GFR calc Af Amer: >60 mL/min (ref >60)
GFR calc non Af Amer: >60 mL/min (ref >60)
Glucose, Bld: 109 mg/dL — ABNORMAL HIGH (ref 70–99)
Potassium: 5 mmol/L (ref 3.5–5.1)
Sodium: 135 mmol/L (ref 135–145)

## 2018-08-23 MED ORDER — LABETALOL HCL 5 MG/ML IV SOLN
10.0000 mg | Freq: Once | INTRAVENOUS | Status: AC
  Administered 2018-08-23: 01:00:00 10 mg via INTRAVENOUS
  Filled 2018-08-23: qty 4

## 2018-08-23 MED ORDER — HYDROCODONE-ACETAMINOPHEN 5-325 MG PO TABS
1.0000 | ORAL_TABLET | Freq: Four times a day (QID) | ORAL | Status: DC | PRN
  Administered 2018-08-23 – 2018-08-25 (×5): 1 via ORAL
  Filled 2018-08-23 (×5): qty 1

## 2018-08-23 MED ORDER — AMLODIPINE BESYLATE 5 MG PO TABS
5.0000 mg | ORAL_TABLET | Freq: Every day | ORAL | Status: DC
  Administered 2018-08-23 – 2018-08-25 (×3): 5 mg via ORAL
  Filled 2018-08-23 (×3): qty 1

## 2018-08-23 NOTE — Progress Notes (Signed)
MD has been notified regarding patient's elevated blood pressures. Patient has had elevated blood pressures off and on during the day and night shift. BP was 186/85 and 5 mg of hydralazine was given, order is Q4 PRN. BP decreased shortly after to 157/78 and pulse 108. BP is currently 172/90 with pulse of 101. Has not been 4 hours since last dose of hydralazine. Isabelle Course, RN

## 2018-08-23 NOTE — TOC Transition Note (Addendum)
Transition of Care [facility]) - CM/SW Discharge Note   Patient Details  Name: Heather Brady MRN: [id number] Date of Birth: 04/16/1946  [facility] CM/SW Contact:  Golda Acre, RN Phone Number: 08/23/2018, 11:57 AM   Clinical Narrative:    Spoke wit daughter gave her information for [facility] and [facility], both have available beds.  [facility] and [facility] do not have beds until sometime next week.  Would to research facilities and will call me back with decision. tcf-daughter- would like for mother to go to [facility].  Information sent to [facility].  [facility]'s wish to come there.  Final next level of care: Skilled Nursing Facility Barriers to Discharge: No Barriers Identified   Patient Goals and CMS Choice Patient states their goals for this hospitalization and ongoing recovery are:: daughter wants her to go to snf due to amount of care she needs each day. CMS Medicare.gov Compare Post Acute Care list provided to:: Patient Represenative (must comment)(daughter) Choice offered to / list presented to : Adult Children  Discharge Placement                       Discharge Plan and Services   Discharge Planning Services: CM Consult Post Acute Care Choice: Skilled Nursing Facility                    Social Determinants of Health (SDOH) Interventions     Readmission Risk Interventions No flowsheet data found.

## 2018-08-23 NOTE — Progress Notes (Signed)
PROGRESS NOTE    Heather Brady  ZOX:[id number] DOB: [birth year] DOA: 08/18/2018 PCP: Lanice Schwab, MD   Brief Narrative:  HPI On 08/18/2018 by Dr. John Giovanni Heather Brady is a 73 y.o. female with medical history significant of PUD, GERD, fibromyalgia, hypertension, nephrolithiasis, parkinsonism, gait difficulty presenting to the hospital for evaluation of dark stools.  Patient states since this morning she has had 3 episodes of dark black-colored stools.  Denies noticing any bright red blood in her stool.  Reports bilateral lower quadrant constant dull pain.  She takes NSAIDs for chronic back pain regularly 3 times a day.  States she had a GI bleed previously 25 years ago and had an endoscopy at that time.  Reports history of ulcers.  States she had colonoscopy done 3 years ago and was told it was clear.  Denies any lightheadedness, chest pain, or shortness of breath.  Denies any fevers, chills, cough, or dysuria.  Interim history Admitted with colitis. Found to be positive for C difficile and started on oral vancomycin.  Assessment & Plan   Colitis, C. Difficile versus NSAID -Patient was recently on antibiotics approximately 1 month ago -Patient was having several dark-colored stools prior to admission -She was recently on NSAIDs for chronic back pain -C. difficile PCR positive -Gastroenterology consulted and appreciated-continue to treat C. difficile with oral vancomycin.  This could also be related to NSAID induced or ischemic colitis -Continue oral vancomycin -continue pain control- will start oral medication   Cortical basal ganglionic degeneration -Followed outpatient by Dr. Arbutus Leas, neurologist  Hypokalemia -resolved with replacement- will discontinue scheduled potassium  -continue to monitor   Hypomagnesemia -Magnesium replaced -continue to monitor   Acute on chronic microcytic anemia -stable, Hemoglobin currently 8.7, continue to monitor CBC  Hypothyroidism  -Continue synthroid   Cachexia -Currently on soft diet -Continue feeding supplementation and multivitamin  Deconditioning -PT recommending SNF  Essential hypertension -Will restart amlodipine and continue to monitor  DVT Prophylaxis  Lovenox  Code Status: DNR  Family Communication: None at bedside.   Disposition Plan: Admitted. Pending SNF placement  Consultants Gastroenterology   Procedures  None  Antibiotics   Anti-infectives (From admission, onward)   Start     Dose/Rate Route Frequency Ordered Stop   08/20/18 1400  vancomycin (VANCOCIN) 50 mg/mL oral solution 125 mg     125 mg Oral 4 times daily 08/20/18 1151 08/30/18 1359   08/19/18 0800  ciprofloxacin (CIPRO) IVPB 400 mg  Status:  Discontinued     400 mg 200 mL/hr over 60 Minutes Intravenous Every 12 hours 08/18/18 2122 08/20/18 1120   08/19/18 0600  metroNIDAZOLE (FLAGYL) IVPB 500 mg  Status:  Discontinued     500 mg 100 mL/hr over 60 Minutes Intravenous Every 8 hours 08/18/18 2122 08/20/18 1120   08/18/18 2230  metroNIDAZOLE (FLAGYL) IVPB 500 mg     500 mg 100 mL/hr over 60 Minutes Intravenous  Once 08/18/18 2210 08/19/18 0011   08/18/18 2030  ciprofloxacin (CIPRO) IVPB 400 mg     400 mg 200 mL/hr over 60 Minutes Intravenous  Once 08/18/18 2027 08/18/18 2259   08/18/18 2030  metroNIDAZOLE (FLAGYL) IVPB 500 mg  Status:  Discontinued     500 mg 100 mL/hr over 60 Minutes Intravenous  Once 08/18/18 2027 08/19/18 0012      Subjective:   Heather Brady seen and examined today.  No complaints this morning.  Denies chest pain, shortness of breath, nausea or vomiting, headache or dizziness.  Would like to go home.  Objective:   Vitals:   08/22/18 2114 08/22/18 2349 08/23/18 0203 08/23/18 0523  BP: (!) 157/78 (!) 172/90 (!) 153/74 (!) 165/84  Pulse: (!) 108 (!) 101 88 87  Resp:  18 16 18   Temp:  98.8 F (37.1 C) 98.8 F (37.1 C) 98 F (36.7 C)  TempSrc:  Oral Oral Oral  SpO2: 97% 98% 96% 98%  Weight:       Height:        Intake/Output Summary (Last 24 hours) at 08/23/2018 1158 Last data filed at 08/23/2018 0955 Gross per 24 hour  Intake 2679.22 ml  Output 3200 ml  Net -520.78 ml   Filed Weights   08/18/18 1812  Weight: 65.8 kg   Exam  General: Well developed, likely ill-appearing, cachectic, NAD  HEENT: NCAT,mucous membranes moist.   Cardiovascular: S1 S2 auscultated, RRR  Respiratory: Clear to auscultation bilaterally   Abdomen: Soft, nontender, nondistended, + bowel sounds  Extremities: warm dry without cyanosis clubbing or edema  Neuro: AAOx3, slow to respond, nonfocal  Psych: Flat however appropriate   Data Reviewed: I have personally reviewed following labs and imaging studies  CBC: Recent Labs  Lab 08/18/18 1818 08/19/18 0256 08/20/18 1009 08/21/18 0442 08/22/18 0338 08/22/18 0955 08/23/18 0755  WBC 11.1* 9.3 7.0 8.4  --   --   --   NEUTROABS  --   --  4.6 5.6  --   --   --   HGB 7.0* 7.5* 7.4* 8.3* 8.6* 8.9* 8.7*  HCT 25.6* 25.7* 26.3* 29.3* 30.9* 32.2* 30.9*  MCV 72.1* 72.4* 73.1* 73.3*  --   --   --   PLT 305 291 309 342  --   --   --    Basic Metabolic Panel: Recent Labs  Lab 08/18/18 1920 08/19/18 0256 08/20/18 1009 08/21/18 0442 08/22/18 0338 08/23/18 0317  NA  --  138 138 138 138 135  K  --  3.2* 3.1* 3.6 4.8 5.0  CL  --  108 108 107 108 106  CO2  --  23 22 24 22  21*  GLUCOSE  --  111* 111* 113* 112* 109*  BUN  --  13 5* <5* 6* 7*  CREATININE  --  0.81 0.84 0.77 0.75 0.83  CALCIUM  --  9.2 9.5 9.9 10.1 10.5*  MG 1.7  --   --  1.5* 2.0  --   PHOS  --   --  2.8 2.4*  --   --    GFR: Estimated Creatinine Clearance: 64.6 mL/min (by C-G formula based on SCr of 0.83 mg/dL). Liver Function Tests: Recent Labs  Lab 08/18/18 1818 08/19/18 0256 08/20/18 1009 08/21/18 0442  AST 14* 14*  --   --   ALT 9 9  --   --   ALKPHOS 95 81  --   --   BILITOT 0.5 0.8  --   --   PROT 7.1 6.1*  --   --   ALBUMIN 3.3* 2.8* 2.9* 3.2*   Recent  Labs  Lab 08/18/18 1920  LIPASE 23   No results for input(s): AMMONIA in the last 168 hours. Coagulation Profile: No results for input(s): INR, PROTIME in the last 168 hours. Cardiac Enzymes: No results for input(s): CKTOTAL, CKMB, CKMBINDEX, TROPONINI in the last 168 hours. BNP (last 3 results) No results for input(s): PROBNP in the last 8760 hours. HbA1C: No results for input(s): HGBA1C in the last 72 hours. CBG:  No results for input(s): GLUCAP in the last 168 hours. Lipid Profile: No results for input(s): CHOL, HDL, LDLCALC, TRIG, CHOLHDL, LDLDIRECT in the last 72 hours. Thyroid Function Tests: No results for input(s): TSH, T4TOTAL, FREET4, T3FREE, THYROIDAB in the last 72 hours. Anemia Panel: No results for input(s): VITAMINB12, FOLATE, FERRITIN, TIBC, IRON, RETICCTPCT in the last 72 hours. Urine analysis: No results found for: COLORURINE, APPEARANCEUR, LABSPEC, PHURINE, GLUCOSEU, HGBUR, BILIRUBINUR, KETONESUR, PROTEINUR, UROBILINOGEN, NITRITE, LEUKOCYTESUR Sepsis Labs: @LABRCNTIP (procalcitonin:4,lacticidven:4)  ) Recent Results (from the past 240 hour(s))  Gastrointestinal Panel by PCR , Stool     Status: None   Collection Time: 08/18/18  8:27 PM  Result Value Ref Range Status   Campylobacter species NOT DETECTED NOT DETECTED Final   Plesimonas shigelloides NOT DETECTED NOT DETECTED Final   Salmonella species NOT DETECTED NOT DETECTED Final   Yersinia enterocolitica NOT DETECTED NOT DETECTED Final   Vibrio species NOT DETECTED NOT DETECTED Final   Vibrio cholerae NOT DETECTED NOT DETECTED Final   Enteroaggregative E coli (EAEC) NOT DETECTED NOT DETECTED Final   Enteropathogenic E coli (EPEC) NOT DETECTED NOT DETECTED Final   Enterotoxigenic E coli (ETEC) NOT DETECTED NOT DETECTED Final   Shiga like toxin producing E coli (STEC) NOT DETECTED NOT DETECTED Final   Shigella/Enteroinvasive E coli (EIEC) NOT DETECTED NOT DETECTED Final   Cryptosporidium NOT DETECTED NOT  DETECTED Final   Cyclospora cayetanensis NOT DETECTED NOT DETECTED Final   Entamoeba histolytica NOT DETECTED NOT DETECTED Final   Giardia lamblia NOT DETECTED NOT DETECTED Final   Adenovirus F40/41 NOT DETECTED NOT DETECTED Final   Astrovirus NOT DETECTED NOT DETECTED Final   Norovirus GI/GII NOT DETECTED NOT DETECTED Final   Rotavirus A NOT DETECTED NOT DETECTED Final   Sapovirus (I, II, IV, and V) NOT DETECTED NOT DETECTED Final    Comment: Performed at [facility], [address] [place], [place] [address]  C difficile quick scan w PCR reflex     Status: Abnormal   Collection Time: 08/18/18  8:27 PM  Result Value Ref Range Status   C Diff antigen POSITIVE (A) NEGATIVE Final   C Diff toxin NEGATIVE NEGATIVE Final   C Diff interpretation Results are indeterminate. See PCR results.  Final    Comment: Performed at [facility], [address], [place], [place] [address]  C. Diff by PCR, Reflexed     Status: Abnormal   Collection Time: 08/18/18  8:27 PM  Result Value Ref Range Status   Toxigenic C. Difficile by PCR POSITIVE (A) NEGATIVE Final    Comment: Positive for toxigenic C. difficile with little to no toxin production. Only treat if clinical presentation suggests symptomatic illness. Performed at [facility], [address], [place], [place] [address]       Radiology Studies: No results found.   Scheduled Meds: . amLODipine  5 mg Oral Daily  . calcium carbonate  1 tablet Oral BID  . feeding supplement (ENSURE ENLIVE)  237 mL Oral BID BM  . levothyroxine  75 mcg Oral QAC breakfast  . multivitamin with minerals  1 tablet Oral Daily  . vancomycin  125 mg Oral QID   Continuous Infusions: . lactated ringers with kcl 75 mL/hr at 08/23/18 0555     LOS: 4 days   Time Spent in minutes   30 minutes  Kaylin Marcon D.O. on 08/23/2018 at 11:58 AM  Between 7am to 7pm - Please see pager noted on [email or web address]  After 7pm go to [email or web address]   And look for the night coverage person covering for me after hours  [facility] Office  [phone]

## 2018-08-23 NOTE — Discharge Summary (Addendum)
Physician Discharge Summary  Heather GrandchildSharon K Brady JYN:[id number] DOB: 1945/09/10 DOA: 08/18/2018  PCP: Heather Brady  Admit date: 08/18/2018 Discharge date: 08/25/2018  Time spent: 45 minutes  Recommendations for Outpatient Follow-up:  Patient will be discharged to skilled nursing facility, continue physical and occupational therapy.  Patient will need to follow up with primary care provider within one week of discharge, repeat CBC and BMP.  Patient should continue medications as prescribed.  Patient should follow a soft diet.   Discharge Diagnoses:  Colitis, C. Difficile versus NSAID Cortical basal ganglionic degeneration Hypokalemia Hypomagnesemia Acute on chronic microcytic anemia Hypothyroidism Cachexia Deconditioning Essential hypertension  Discharge Condition: Stable  Diet recommendation: Soft  Filed Weights   08/18/18 1812  Weight: 65.8 kg    History of present illness:  On 08/18/2018 by Dr. Everardo BealsVasundhra Rathore Pleshette K Damronis a 73 y.o.femalewith medical history significant ofPUD, GERD, fibromyalgia, hypertension, nephrolithiasis, parkinsonism, gait difficulty presenting to the hospital for evaluation ofdarkstools.Patient states since this morning she has had 3 episodes of dark black-colored stools. Denies noticing any bright red blood in her stool. Reports bilateral lower quadrant constant dull pain. She takes NSAIDs for chronic back pain regularly 3 times a day. States she had a GI bleed previously 25 years ago and had an endoscopy at that time. Reports history of ulcers. States she had colonoscopy done 3 years ago and was told it was clear. Denies any lightheadedness, chest pain, or shortness of breath. Denies any fevers, chills, cough, or dysuria.  Hospital Course:  Colitis, C. Difficile versus NSAID -Patient was recently on antibiotics approximately 1 month ago -Patient was having several dark-colored stools prior to admission -She was recently on  NSAIDs for chronic back pain -C. difficile PCR positive -Gastroenterology consulted and appreciated-continue to treat C. difficile with oral vancomycin.  This could also be related to NSAID induced or ischemic colitis -Continue oral vancomycin -continue pain control  Cortical basal ganglionic degeneration -Followed outpatient by Dr. Arbutus Leasat, neurologist  Hypokalemia -resolved with replacement -Repeat BMP in one week  Hypomagnesemia -Magnesium replaced  Acute on chronic microcytic anemia -stable, Hemoglobin currently 8.7 -repeat CBC in one week -avoid NSAIDs  Hypothyroidism -Continue synthroid   Cachexia -Currently on soft diet -Continue feeding supplementation and multivitamin  Deconditioning -PT recommending SNF  Essential hypertension -Continue amlodipine   Procedures: None   Consultations: Gastroenterology  Code status: DNR  Discharge Exam: Vitals:   08/24/18 0511 08/24/18 0603  BP: (!) 177/89 (!) 143/73  Pulse: 97 (!) 101  Resp: 17   Temp: 97.9 F (36.6 C)   SpO2: 99%      General: Well developed, chronically ill appearing, thin, NAD  HEENT: NCAT, mucous membranes moist.  Cardiovascular: S1 S2 auscultated, RRR  Respiratory: Clear to auscultation bilaterally  Abdomen: Soft, nontender, nondistended, + bowel sounds  Extremities: warm dry without cyanosis clubbing or edema  Neuro: AAOx3, slow to respond, nonfocal  Psych: flat, however appropriate  Discharge Instructions Discharge Instructions    Discharge instructions   Complete by:  As directed    Patient will be discharged to skilled nursing facility, continue physical and occupational therapy.  Patient will need to follow up with primary care provider within one week of discharge, repeat CBC and BMP.  Patient should continue medications as prescribed.  Patient should follow a soft diet.     Allergies as of 08/24/2018      Reactions   Amoxicillin Itching   Did it involve swelling of  the face/tongue/throat, SOB, or  low BP? Yes Did it involve sudden or severe rash/hives, skin peeling, or any reaction on the inside of your mouth or nose? No Did you need to seek medical attention at a hospital or doctor's office? No When did it last happen?unknown  If all above answers are NO, may proceed with cephalosporin use.   Carbidopa-levodopa Itching      Medication List    STOP taking these medications   esomeprazole 40 MG capsule Commonly known as:  NEXIUM   ibuprofen 800 MG tablet Commonly known as:  ADVIL   naproxen sodium 220 MG tablet Commonly known as:  ALEVE     TAKE these medications   amLODipine 10 MG tablet Commonly known as:  NORVASC Take 5 mg by mouth daily.   calcium carbonate 500 MG chewable tablet Commonly known as:  TUMS - dosed in mg elemental calcium Chew 1 tablet (200 mg of elemental calcium total) by mouth 2 (two) times daily.   feeding supplement (ENSURE ENLIVE) Liqd Take 237 mLs by mouth 2 (two) times daily between meals.   gabapentin 100 MG capsule Commonly known as:  NEURONTIN Take 100 mg by mouth 3 (three) times daily.   HYDROcodone-acetaminophen 5-325 MG tablet Commonly known as:  NORCO/VICODIN Take 1 tablet by mouth every 6 (six) hours as needed for moderate pain.   levothyroxine 75 MCG tablet Commonly known as:  SYNTHROID Take 75 mcg by mouth daily before breakfast.   multivitamin with minerals Tabs tablet Take 1 tablet by mouth daily.   vancomycin 50 mg/mL  oral solution Commonly known as:  VANCOCIN Take 2.5 mLs (125 mg total) by mouth 4 (four) times daily for 10 days.   Vitamin D (Ergocalciferol) 1.25 MG (50000 UT) Caps capsule Commonly known as:  DRISDOL Take 50,000 Units by mouth every 14 (fourteen) days.      Allergies  Allergen Reactions   Amoxicillin Itching    Did it involve swelling of the face/tongue/throat, SOB, or low BP? Yes Did it involve sudden or severe rash/hives, skin peeling, or any reaction  on the inside of your mouth or nose? No Did you need to seek medical attention at a hospital or doctor's office? No When did it last happen?unknown  If all above answers are NO, may proceed with cephalosporin use.    Carbidopa-Levodopa Itching   Follow-up Information    Heather Schwab, Brady. Schedule an appointment as soon as possible for a visit in 1 week(s).   Specialty:  Family Medicine Why:  Hospital follow up Contact information: [address] [place] [address] [phone]            The results of significant diagnostics from this hospitalization (including imaging, microbiology, ancillary and laboratory) are listed below for reference.    Significant Diagnostic Studies: Ct Abdomen Pelvis W Contrast  Result Date: 08/18/2018 CLINICAL DATA:  Abdominal pain and GI bleeding EXAM: CT ABDOMEN AND PELVIS WITH CONTRAST TECHNIQUE: Multidetector CT imaging of the abdomen and pelvis was performed using the standard protocol following bolus administration of intravenous contrast. CONTRAST:  OMNIPAQUE 300 COMPARISON:  None. FINDINGS: Lower chest: Lung bases are free of acute infiltrate or sizable effusion. Hepatobiliary: Liver is well visualized and within normal limits. The gallbladder is decompressed with gallstones within. No pericholecystic fluid is noted. Pancreas: Unremarkable. No pancreatic ductal dilatation or surrounding inflammatory changes. Spleen: Normal in size without focal abnormality. Adrenals/Urinary Tract: Adrenal glands are within normal limits. Kidneys are well visualized bilaterally. No  renal calculi or urinary tract obstructive changes are seen. There is a 1.9 cm cyst in the midportion of the right kidney. Delayed images demonstrate normal excretion of contrast material. The bladder is well distended and within normal limits. Stomach/Bowel: Scattered diverticular change of the colon is noted. Mild inflammatory changes are noted about  the cecum and ascending colon. Similar changes are noted in the region of the rectum as well. The appendix is visualized without significant dilatation at the upper limits of normal in size. No significant peri appendiceal inflammatory changes are noted to suggest appendicitis. Small bowel shows no obstructive changes. A large hiatal hernia is noted with the majority of the stomach within the chest cavity. Vascular/Lymphatic: Atherosclerotic calcifications are noted without aneurysmal dilatation or dissection. No significant lymphadenopathy is seen. Reproductive: Uterus has been surgically removed. Other: Minimal free pelvic fluid is noted.  No free air is seen. Musculoskeletal: Degenerative changes of lumbar spine are noted. No compression deformities are seen. Degenerative anterolisthesis of L4 on L5 is noted. IMPRESSION: Scattered areas of inflammatory change involving the colon primarily in the ascending colon and cecum as well as the rectum suggestive of multifocal colitis. No obstructive changes are seen. No changes of perforation are noted. Upper limits of normal appendix without significant periappendiceal inflammatory change. Diverticulosis without diverticulitis. Cholelithiasis without complicating factors. Electronically Signed   By: Alcide Clever M.D.   On: 08/18/2018 19:48    Microbiology: Recent Results (from the past 240 hour(s))  Gastrointestinal Panel by PCR , Stool     Status: None   Collection Time: 08/18/18  8:27 PM  Result Value Ref Range Status   Campylobacter species NOT DETECTED NOT DETECTED Final   Plesimonas shigelloides NOT DETECTED NOT DETECTED Final   Salmonella species NOT DETECTED NOT DETECTED Final   Yersinia enterocolitica NOT DETECTED NOT DETECTED Final   Vibrio species NOT DETECTED NOT DETECTED Final   Vibrio cholerae NOT DETECTED NOT DETECTED Final   Enteroaggregative E coli (EAEC) NOT DETECTED NOT DETECTED Final   Enteropathogenic E coli (EPEC) NOT DETECTED NOT  DETECTED Final   Enterotoxigenic E coli (ETEC) NOT DETECTED NOT DETECTED Final   Shiga like toxin producing E coli (STEC) NOT DETECTED NOT DETECTED Final   Shigella/Enteroinvasive E coli (EIEC) NOT DETECTED NOT DETECTED Final   Cryptosporidium NOT DETECTED NOT DETECTED Final   Cyclospora cayetanensis NOT DETECTED NOT DETECTED Final   Entamoeba histolytica NOT DETECTED NOT DETECTED Final   Giardia lamblia NOT DETECTED NOT DETECTED Final   Adenovirus F40/41 NOT DETECTED NOT DETECTED Final   Astrovirus NOT DETECTED NOT DETECTED Final   Norovirus GI/GII NOT DETECTED NOT DETECTED Final   Rotavirus A NOT DETECTED NOT DETECTED Final   Sapovirus (I, II, IV, and V) NOT DETECTED NOT DETECTED Final    Comment: Performed at [facility], [address] [place], [place] [address]  C difficile quick scan w PCR reflex     Status: Abnormal   Collection Time: 08/18/18  8:27 PM  Result Value Ref Range Status   C Diff antigen POSITIVE (A) NEGATIVE Final   C Diff toxin NEGATIVE NEGATIVE Final   C Diff interpretation Results are indeterminate. See PCR results.  Final    Comment: Performed at [facility], [address], [place], [place] [address]  C. Diff by PCR, Reflexed     Status: Abnormal   Collection Time: 08/18/18  8:27 PM  Result Value Ref Range Status   Toxigenic C. Difficile by PCR  POSITIVE (A) NEGATIVE Final    Comment: Positive for toxigenic C. difficile with little to no toxin production. Only treat if clinical presentation suggests symptomatic illness. Performed at [facility], [address], [place], [place] [address]      Labs: Basic Metabolic Panel: Recent Labs  Lab 08/18/18 1920 08/19/18 0256 08/20/18 1009 08/21/18 0442 08/22/18 0338 08/23/18 0317  NA  --  138 138 138 138 135  K  --  3.2* 3.1* 3.6 4.8 5.0  CL  --  108 108 107 108 106  CO2  --  21*  GLUCOSE  --  111* 111* 113* 112* 109*  BUN  --  13 5* <5* 6* 7*  CREATININE   --  0.81 0.84 0.77 0.75 0.83  CALCIUM  --  9.2 9.5 9.9 10.1 10.5*  MG 1.7  --   --  1.5* 2.0  --   PHOS  --   --  2.8 2.4*  --   --    Liver Function Tests: Recent Labs  Lab 08/18/18 1818 08/19/18 0256 08/20/18 1009 08/21/18 0442  AST 14* 14*  --   --   ALT 9 9  --   --   ALKPHOS 95 81  --   --   BILITOT 0.5 0.8  --   --   PROT 7.1 6.1*  --   --   ALBUMIN 3.3* 2.8* 2.9* 3.2*   Recent Labs  Lab 08/18/18 1920  LIPASE 23   No results for input(s): AMMONIA in the last 168 hours. CBC: Recent Labs  Lab 08/18/18 1818 08/19/18 0256 08/20/18 1009 08/21/18 0442 08/22/18 0338 08/22/18 0955 08/23/18 0755  WBC 11.1* 9.3 7.0 8.4  --   --   --   NEUTROABS  --   --  4.6 5.6  --   --   --   HGB 7.0* 7.5* 7.4* 8.3* 8.6* 8.9* 8.7*  HCT 25.6* 25.7* 26.3* 29.3* 30.9* 32.2* 30.9*  MCV 72.1* 72.4* 73.1* 73.3*  --   --   --   PLT 305 291 309 342  --   --   --    Cardiac Enzymes: No results for input(s): CKTOTAL, CKMB, CKMBINDEX, TROPONINI in the last 168 hours. BNP: BNP (last 3 results) No results for input(s): BNP in the last 8760 hours.  ProBNP (last 3 results) No results for input(s): PROBNP in the last 8760 hours.  CBG: No results for input(s): GLUCAP in the last 168 hours.     Signed:  Edsel Petrin  [facility] 08/24/2018, 11:12 AM

## 2018-08-23 NOTE — NC FL2 (Signed)
[place] MEDICAID FL2 LEVEL OF CARE SCREENING TOOL     IDENTIFICATION  Patient Name: Heather Brady Birthdate: 21-Nov-1945 Sex: female Admission Date (Current Location): 08/18/2018  [facility] and [place] Number:  Producer, television/film/video and Address:  [facility],  [address] [place] [address]      Provider Number: [id number]  Attending Physician Name and Address:  Edsel Petrin, DO  Relative Name and Phone Number:       Current Level of Care: Hospital Recommended Level of Care: Skilled Nursing Facility Prior Approval Number:    Date Approved/Denied:   PASRR Number: [id number]  Discharge Plan: SNF    Current Diagnoses: Patient Active Problem List   Diagnosis Date Noted  . GI bleed 08/18/2018  . Hypokalemia 08/18/2018  . Anemia 08/18/2018  . GERD (gastroesophageal reflux disease) 08/18/2018  . HTN (hypertension) 08/18/2018  . H/O vitamin D deficiency 12/28/2017  . Hypercalcemia 06/08/2017  . Gait difficulty 10/20/2014  . Parkinsonism (HCC) 10/20/2014  . Dizziness and giddiness 10/20/2014    Orientation RESPIRATION BLADDER Height & Weight     Self, Time, Situation, Place  Normal Continent Weight: 65.8 kg Height:  5\' 9"  (175.3 cm)  BEHAVIORAL SYMPTOMS/MOOD NEUROLOGICAL BOWEL NUTRITION STATUS      Continent Diet(see dec summary)  AMBULATORY STATUS COMMUNICATION OF NEEDS Skin   Extensive Assist Verbally Normal                       Personal Care Assistance Level of Assistance  Bathing, Feeding, Dressing Bathing Assistance: Limited assistance Feeding assistance: Independent Dressing Assistance: Limited assistance     Functional Limitations Info  Sight, Hearing, Speech Sight Info: Adequate Hearing Info: Adequate Speech Info: Adequate    SPECIAL CARE FACTORS FREQUENCY  PT (By licensed PT), OT (By licensed OT)     PT Frequency: x5/week OT Frequency: x5/week            Contractures Contractures Info: Not present     Additional Factors Info  Code Status, Allergies Code Status Info: DNR Allergies Info: Amoxicillin, levodopa           Current Medications (08/23/2018):  This is the current hospital active medication list Current Facility-Administered Medications  Medication Dose Route Frequency Provider Last Rate Last Dose  . acetaminophen (TYLENOL) tablet 650 mg  650 mg Oral Q6H PRN John Giovanni, MD   650 mg at 08/23/18 0530   Or  . acetaminophen (TYLENOL) suppository 650 mg  650 mg Rectal Q6H PRN John Giovanni, MD      . calcium carbonate (TUMS - dosed in mg elemental calcium) chewable tablet 200 mg of elemental calcium  1 tablet Oral BID Rhetta Mura, MD   200 mg of elemental calcium at 08/23/18 0920  . feeding supplement (ENSURE ENLIVE) (ENSURE ENLIVE) liquid 237 mL  237 mL Oral BID BM Edsel Petrin, DO   237 mL at 08/23/18 0918  . hydrALAZINE (APRESOLINE) injection 5 mg  5 mg Intravenous Q4H PRN John Giovanni, MD   5 mg at 08/23/18 0558  . lactated ringers 1,000 mL with potassium chloride 20 mEq infusion   Intravenous Continuous Rhetta Mura, MD 75 mL/hr at 08/23/18 0555    . levothyroxine (SYNTHROID, LEVOTHROID) tablet 75 mcg  75 mcg Oral QAC breakfast Rhetta Mura, MD   75 mcg at 08/23/18 0530  . lip balm (CARMEX) ointment 1 application  1 application Topical PRN Rhetta Mura, MD   1 application at 08/20/18 2111  .  morphine 2 MG/ML injection 0.5 mg  0.5 mg Intravenous Q3H PRN Edsel PetrinMikhail, Maryann, DO   0.5 mg at 08/23/18 [id number]  . multivitamin with minerals tablet 1 tablet  1 tablet Oral Daily Edsel PetrinMikhail, Maryann, DO   1 tablet at 08/23/18 0920  . vancomycin (VANCOCIN) 50 mg/mL oral solution 125 mg  125 mg Oral QID Rhetta MuraSamtani, Jai-Gurmukh, MD   125 mg at 08/23/18 0920     Discharge Medications: Please see discharge summary for a list of discharge medications.  Relevant Imaging Results:  Relevant Lab Results:   Additional  Information ssn:[id number]  Golda Acreavis,  Lynn, RN

## 2018-08-23 NOTE — Discharge Instructions (Signed)
Clostridium Difficile Infection  Clostridium difficile (C. difficile or C. diff) infection is a condition that occurs when an overgrowth of C. diff bacteria causes irritation and swelling (inflammation) of the colon (colitis).  This infection can be passed from person to person (can be contagious). You may also be exposed to the bacteria from the environment, such as from food or water or from touching surfaces that have the bacteria on them.  What are the causes?  Certain bacteria normally live in the colon and help to digest food. This infection develops when the balance of bacteria in the colon is changed and C. diff grows out of control. This is often caused by taking antibiotics.  What increases the risk?  This condition is more likely to develop in people who:  · Need to take antibiotics for a long period of time.  · Take certain antibiotics that kill a wide range of bacteria.  · Are in the hospital.  · Are older.  · Live in a place where there is a lot of contact with others, such as a nursing home.  · Have had a C. diff infection before.  · Have a weak defense (immune) system.  · Take a medicine called proton pump inhibitors over a long period of time (chronic use).  · Have serious underlying conditions, such as colon cancer.  · Have had gastrointestinal (GI) tract procedure or surgery.  What are the signs or symptoms?  Symptoms of this condition include:  · Diarrhea. This may be bloody, watery, or yellow or green in color.  · Fever.  · Fatigue.  · Loss of appetite.  · Nausea.  · Swelling, pain, or tenderness in the abdomen.  How is this diagnosed?  This condition is diagnosed with medical history and physical exam. You may also have other tests, including:  · A test that checks for C. diff in your stool.  · Blood tests.  · Imaging tests, such as a CT scan of your abdomen.  In rare cases, a sigmoidoscopy or colonoscopy may be used to look directly into your colon. These procedures involve passing an  instrument through your rectum to look at the inside of your colon.  How is this treated?  Treatment for this condition includes:  · Stopping the antibiotics that you were on when the C. diff infection began. Do this only as told by your health care provider.  · Taking certain antibiotics to stop C. diff from growing.  · Taking donor stool from a healthy person and placing it into the colon (fecal transplant) through a colonoscope or nasogastric tube. This may be done if you have a recurrent infection.  · Having surgery to remove the infected part of the colon (rare).  Follow these instructions at home:  Eating and drinking    · Eat bland, easy-to-digest foods in small amounts as you are able. These foods include bananas, applesauce, rice, lean meats, toast, and crackers.  · Follow specific instructions on how to get enough water into your body (rehydrate). This may include:  ? Drinking clear fluids, such as water, ice chips, diluted fruit juice, and low-calorie sports drinks.  ? Taking an oral rehydration solution (ORS). This is a drink that is sold at pharmacies and retail stores.  · Avoid milk, caffeine, and alcohol.  · Drink enough fluid to keep your urine pale yellow.  General instructions  · Take over-the-counter and prescription medicines only as told by your health care provider.  ·   Take your antibiotic medicine as told by your health care provider. Do not stop taking the antibiotic, even if you start to feel better. You may stop taking it only if your health care provider tells you to stop.  · Wash your hands with soap and water. If soap and water are not available, use hand sanitizer.  · Do not use medicines to help with diarrhea.  · Keep all follow-up visits as told by your health care provider. This is important.  How is this prevented?  Hand hygiene    · Use soap and water to wash your hands thoroughly before you prepare food and after you use the bathroom. Make sure that people who live with you also  wash their hands often.  · If you are being treated at a hospital or clinic, make sure that all health care providers wash their hands with soap and water before touching you.  · If you are in the hospital, make sure that all visitors wash their hands with soap and water before touching you.  Contact precautions  · If you develop diarrhea while in the hospital or a long-term care facility, let your health care team know right away.  · When visiting someone in the hospital or a long-term care facility, follow guidelines for wearing a gown, gloves, or other protective equipment.  · If possible, avoid contact with people who have diarrhea.  Clean environment  · Clean surfaces with a product that contains chlorine bleach.  · If you are in the hospital, make sure that staff members clean the surfaces in your room daily. Let a staff person know right away if body fluids have splashed or spilled in your room.  Contact a health care provider if:  · Your symptoms do not get better with treatment.  · Your symptoms get worse, even with treatment.  · Your symptoms go away and then come back.  · You have a fever.  · You have new symptoms.  Get help right away if:  · You have more pain or tenderness in your abdomen.  · You have stool that is mostly bloody, or your stool looks dark black and tarry.  · You cannot eat or drink without vomiting.  · You have signs or symptoms of dehydration, such as:  ? Dark urine, very little urine, or no urine.  ? Cracked lips.  ? Not making tears when you cry.  ? Dry mouth.  ? Sunken eyes.  ? Sleepiness.  ? Weakness.  ? Dizziness.  Summary  · Clostridium difficile (C. difficile or C. diff) infection is a condition that occurs when an overgrowth of C. diff bacteria causes irritation and swelling (inflammation) of the colon.  · Symptoms of C. diff infection include diarrhea, fever, fatigue, loss of appetite, nausea, and swelling, pain, or tenderness in the abdomen.  · C. diff infection is treated by  stopping the antibiotics you were on when the C. diff infection began, and then taking certain antibiotics to keep C. diff from growing. In some cases, fecal transplant or surgery is performed.  · Hand washing with soap and water, contact precautions, and a clean environment can help prevent or limit the spread of C. diff infection.  This information is not intended to replace advice given to you by your health care provider. Make sure you discuss any questions you have with your health care provider.  Document Released: 02/01/2005 Document Revised: 01/31/2017 Document Reviewed: 01/31/2017  Elsevier Interactive Patient Education ©   2019 Elsevier Inc.

## 2018-08-24 MED ORDER — CALCIUM CARBONATE ANTACID 500 MG PO CHEW: 1.0000 | CHEWABLE_TABLET | Freq: Two times a day (BID) | ORAL | Status: AC

## 2018-08-24 MED ORDER — ALUM & MAG HYDROXIDE-SIMETH 200-200-20 MG/5ML PO SUSP
30.0000 mL | Freq: Four times a day (QID) | ORAL | Status: DC | PRN
  Administered 2018-08-24 – 2018-08-25 (×2): 30 mL via ORAL
  Filled 2018-08-24 (×2): qty 30

## 2018-08-24 MED ORDER — ENSURE ENLIVE PO LIQD: 237.0000 mL | Freq: Two times a day (BID) | ORAL | 12 refills | Status: AC

## 2018-08-24 MED ORDER — ADULT MULTIVITAMIN W/MINERALS CH: 1.0000 | ORAL_TABLET | Freq: Every day | ORAL | Status: AC

## 2018-08-24 MED ORDER — VANCOMYCIN 50 MG/ML ORAL SOLUTION: 125.0000 mg | Freq: Four times a day (QID) | ORAL | 0 refills | Status: AC

## 2018-08-24 MED ORDER — HYDROCODONE-ACETAMINOPHEN 5-325 MG PO TABS: 1.0000 | ORAL_TABLET | Freq: Four times a day (QID) | ORAL | 0 refills | Status: AC | PRN

## 2018-08-24 NOTE — Progress Notes (Signed)
CSW spoke to pt who is agreeable to D/C to the SNF of her daughter's choice.  CSW was told by Clydie Braun at [facility] that a private room will not be available for pt's C-Dif needs, until Sunday 4/19.    [facility] states they have a room but pt's daughter prefers [facility].  [address] and [facility] also have a private room available on Sunday (pt needs a private room due to pt being diagnosed with C-Diff) and provider now states pt will remain until tomorrow and D/C summary will be changed accordingly.    PLAN: Pt will D/C to [facility] SNF on Sunday 4/19.  Clydie Braun in admissions at [facility] at ph: [phone] is aware and is expecting a call from the 1st shift Medical Floor CSW on 4/19.  Sat 4/18 1st shift CSW will leave a TOC handoff for the Sun 4/19 1st shift CSW.  CSW will continue to follow for D/C needs.  Dorothe Pea. Akon Reinoso, LCSW, LCAS, CSI Transitions of Care Clinical Social Worker Care Coordination Department Ph: [phone]

## 2018-08-24 NOTE — Progress Notes (Signed)
CSW sent D/C summary, SNF transfer report and D/C order bia the hub.  CSW sent PT notes and FL-2 earlier today with pt's/pt's daughter's permission.  CSW will continue to follow for D/C needs.  Heather Brady. Rhet Rorke, LCSW, LCAS, CSI Transitions of Care Clinical Social Worker Care Coordination Department Ph: [phone]

## 2018-08-24 NOTE — TOC Initial Note (Signed)
Transition of Care [facility]) - Initial/Assessment Note    Patient Details  Name: Heather Brady MRN: [id number] Date of Birth: 1946-03-21  [facility]) CM/SW Contact:    [name], LCSW Phone Number: 08/24/2018, 11:12 AM  Clinical Narrative:  CSW met with pt and confirmed pt's/pt's daughter's plan to be discharged to SNF to live at discharge.  CSW provided active listening and validated pt's concerns.  TOC CM/CSW team was provided permission to send FL-2 and send referrals out to SNF facilities via the hub per pt's request and attempt admittance to [place] are specifically on 4/18.  P    Expected Discharge Plan: Skilled Nursing Facility Barriers to Discharge: No Barriers Identified   Patient Goals and CMS Choice Patient states their goals for this hospitalization and ongoing recovery are:: daughter wants her to go to snf due to amount of care she needs each day. CMS Medicare.gov Compare Post Acute Care list provided to:: Patient Represenative (must comment)(Pt's daughter) Choice offered to / list presented to : Adult Children  Expected Discharge Plan and Services Expected Discharge Plan: [place] In-house Referral: Clinical Social Work Discharge Planning Services: CM Consult Post Acute Care Choice: [place] arrangements for the past 2 months: [place]                         Prior Living Arrangements/Services Living arrangements for the past 2 months: Single Family Home Lives with:: Self Patient language and need for interpreter reviewed:: No Do you feel safe going back to the place where you live?: No      Need for Family Participation in Patient Care: Yes (Comment) Care giver support system in place?: Yes (comment)   Criminal Activity/Legal Involvement Pertinent to Current Situation/Hospitalization: No - Comment as needed  Activities of Daily Living Home Assistive Devices/Equipment: Gilford Rile  (specify type) ADL Screening (condition at time of admission) Patient's cognitive ability adequate to safely complete daily activities?: No Is the patient deaf or have difficulty hearing?: Yes Does the patient have difficulty seeing, even when wearing glasses/contacts?: Yes Does the patient have difficulty concentrating, remembering, or making decisions?: Yes Patient able to express need for assistance with ADLs?: Yes Does the patient have difficulty dressing or bathing?: Yes Independently performs ADLs?: No(daughter helps her at home/home aide) Does the patient have difficulty walking or climbing stairs?: Yes Weakness of Legs: Both Weakness of Arms/Hands: Both  Permission Sought/Granted Permission sought to share information with : Facility Art therapist granted to share information with : Yes, Verbal Permission Granted     Permission granted to share info w AGENCY: ([facility])  Permission granted to share info w Relationship: Renita Papa daughter  Permission granted to share info w Contact Information: area nursing homes  Emotional Assessment Appearance:: Appears older than stated age Attitude/Demeanor/Rapport: Engaged Affect (typically observed): Unable to Assess Orientation: : Oriented to Self, Oriented to Situation, Oriented to Place, Oriented to  Time Alcohol / Substance Use: Never Used Psych Involvement: No (comment)  Admission diagnosis:  Colitis [K52.9] Symptomatic anemia [D64.9] GI bleed [K92.2] Patient Active Problem List   Diagnosis Date Noted  . GI bleed 08/18/2018  . Hypokalemia 08/18/2018  . Anemia 08/18/2018  . GERD (gastroesophageal reflux disease) 08/18/2018  . HTN (hypertension) 08/18/2018  . H/O vitamin D deficiency 12/28/2017  . Hypercalcemia 06/08/2017  . Gait difficulty 10/20/2014  . Parkinsonism ([place]) 10/20/2014  . Dizziness and giddiness 10/20/2014  PCP:  Nestor Ramp, MD Pharmacy:   [id number]  [place], [id number]: [phone]: [phone]    Social Determinants of Health (SDOH) Interventions N/A    Readmission Risk Interventions No flowsheet data found.

## 2018-08-24 NOTE — Plan of Care (Signed)
Patient lying in bed this morning; complains of generalized pain; chronic in nature and requests pain medication. Will continue to monitor.

## 2018-08-24 NOTE — Progress Notes (Addendum)
CSW received a call from pt's RN stating per the provider, pt is stable and ready for D/C.  Per RN and chart pt's/pt's daughter 1st choice is [facility] and 2nd choice is [facility].  Per TOC CM's note, [facility] and [facility] have open beds.  Per the RN note from this morning pt's daughter Gerarda Fraction at ph: [phone] is requesting an update from [facility] CM/CSW.  CSW called Meryle Ready from [facility] at ph: [phone] who stated that [facility] does not accept pt's on the weekend and that in addition the DON is still reviewing pt's referral and admissions at [facility] has not been told pt has been accepted yet.  CSW called [facility] who stated they will initiate insurance authorization.  CSW will continue to follow for D/C needs.  Dorothe Pea. Tinaya Ceballos, LCSW, LCAS, CSI Transitions of Care Clinical Social Worker Care Coordination Department Ph: [phone]

## 2018-08-24 NOTE — Progress Notes (Addendum)
CSW called pt's daughter Inetta Fermo at ph: [phone] is requesting an update from [facility] CM/CSW.  CSW spoke to pt's daughter who expressed reluctance at pt D/C'ing due to her anxiety at not being able to see the pt at [facility] or the SNF post-D/C.  In additon, pt's daughter stated she feels this is a last-minute change despite the notes that reflect pt's TOC CM had this conversation with pt's daughter yesterday, per the notes.  Pt's daughter expressed that her primary reason for experiencing anxiety is that she is unable to see or talk to pt/pt's care staff at [facility].  CSW provided active listening and validated pt's daughter's frustrations at not being able to see the pt and speak to providers/RN's in-person and get more detailed info regarding pt's care.  CSW spoke to provider who had prepped pt's daughter for the pt's transition to [facility] as far back as Thursday 4/16 and provider was agreeable to calling the pt's daughter and assisting pt's daughter with further information if needed.  Pt's daughter states to CSW she will choose [facility] and provided CSW with verbal permission to tell Clydie Braun at [facility] pt/pt's daughter chooses this facility and request Clydie Braun at [facility] at ph: [phone] initiate the pt's insurance auth so p[t can be accepted to [facility] SNF..  CSW will continue to follow for D/C needs.  Dorothe Pea. Nai Borromeo, LCSW, LCAS, CSI Transitions of Care Clinical Social Worker Care Coordination Department Ph: [phone]

## 2018-08-24 NOTE — Progress Notes (Signed)
Spoke with patient's daughter Inetta Fermo this evening: Inetta Fermo wants the case manager to call her to let her know the time frame for her to decide where to put her mom. Her number 1 choice is [facility]. Her second choice is [facility]. She also wants to know if her mother's PCP Dr. Maggie Font and Dr. Royetta Crochet (neurologist) can transfer her records to the facility that her mother will be placed at. Gave the daughter an update on the phone. Discussed with the daughter some of the goals for her mother's care at this time prior to discharge: pain control, BP control, continuing to monitor labs, and taking oral vancomycin for treating C.diff.

## 2018-08-25 NOTE — TOC Transition Note (Signed)
Transition of Care [facility]) - CM/SW Discharge Note   Patient Details  Name: Heather Brady MRN: [id number] Date of Birth: May 31, 1945  Transition of Care [facility]) CM/SW Contact:  Baldemar Lenis, LCSW Phone Number: 08/25/2018, 11:01 AM   Clinical Narrative:  Nurse to call report to [phone], Room 114. Transport set for 4:00 PM.     Final next level of care: Skilled Nursing Facility Barriers to Discharge: No Barriers Identified   Patient Goals and CMS Choice Patient states their goals for this hospitalization and ongoing recovery are:: daughter wants her to go to snf due to amount of care she needs each day. CMS Medicare.gov Compare Post Acute Care list provided to:: Patient Represenative (must comment)(Pt's daughter) Choice offered to / list presented to : Adult Children  Discharge Placement              Patient chooses bed at: [facility] Patient to be transferred to facility by: [facility] Name of family member notified: Inetta Fermo, daughter Patient and family notified of of transfer: 08/25/18  Discharge Plan and Services In-house Referral: Clinical Social Work Discharge Planning Services: CM Consult Post Acute Care Choice: Skilled Nursing Facility                    Social Determinants of Health (SDOH) Interventions     Readmission Risk Interventions No flowsheet data found.

## 2018-08-25 NOTE — Progress Notes (Signed)
Discharge summary done on 08/24/2018. No changes to medications or instructions. Patient stable for discharge.

## 2018-08-25 NOTE — Progress Notes (Signed)
Report called to [facility]. PIV removed and pt transferred into the care of [facility]

## 2019-01-02 ENCOUNTER — Ambulatory Visit (INDEPENDENT_AMBULATORY_CARE_PROVIDER_SITE_OTHER): Payer: Medicare Other | Admitting: Internal Medicine

## 2019-01-02 DIAGNOSIS — Z8639 Personal history of other endocrine, nutritional and metabolic disease: Secondary | ICD-10-CM

## 2019-01-02 NOTE — Progress Notes (Signed)
NO SHOW  Patient ID: Heather GrandchildSharon K Brady, female   DOB: Sep 16, 1945, 73 y.o.   MRN: [id number]    HPI  Heather Brady is a 73 y.o.-year-old female, initially referred by her PCP, Nymberg, Dairl PonderJerome H, MD, returning for follow-up for hypercalcemia/primary hyperparathyroidism and history of vitamin D deficiency.  Last visit 1 year ago.  She has had hypercalcemia at least since 2017.  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  She is seen by neurology at [facility].  Her condition is getting worse.  Her balance is poor.  Before last visit she stopped Sinemet because it was not helping.  No treatment available unfortunately.  Her swallowing is still normal.   We establish the diagnosis of primary hyperparathyroidism mild) on the basis of elevated calcium and nonsuppressed PTH.  However, due to the above condition, we are following her clinically based on her and her family's decision to avoid surgery.  I reviewed her pertinent labs: Lab Results  Component Value Date   PTH 34 12/28/2017   PTH Comment 12/28/2017   PTH 43 06/08/2017   CALCIUM 10.5 (H) 08/23/2018   CALCIUM 10.1 08/22/2018   CALCIUM 9.9 08/21/2018   CALCIUM 9.5 08/20/2018   CALCIUM 9.2 08/19/2018   CALCIUM 9.7 08/18/2018   CALCIUM 10.9 (H) 01/26/2018   CALCIUM 10.6 (H) 12/28/2017   CALCIUM 10.5 (H) 06/08/2017  01/25/2017: Calcium 11.3, ionized calcium 5.9, intact PTH 43, PTH related peptide <2.0, vitamin D 25-hydroxy 48, BUN/creatinine  14/0.82; SPEP; polyclonal hypergammaglobulinemia; serum kappa/lambda light chain ratio normal ([facility] was consulted and they recommended repeat SPEP in 6 months) 11/30/2016: Calcium 10.4, ionized calcium 5.7, TSH 0.719, alkaline phosphatase 80 04/27/2016: Calcium 10.5, alkaline phosphatase 78 01/27/2016: Calcium 10.9 11/29/2015: Calcium 10.4  Magnesium, phosphorus, calcitriol levels were normal: Component     Latest Ref Rng & Units 06/08/2017  Vitamin  D 1, 25 (OH) Total     18 - 72 pg/mL 50  Vitamin D3 1, 25 (OH)     pg/mL 21  Vitamin D2 1, 25 (OH)     pg/mL 29  Phosphorus     2.3 - 4.6 mg/dL 2.7  Magnesium     1.5 - 2.5 mg/dL 1.7   40-[id number]-hour urine calcium was normal Component     Latest Ref Rng & Units 06/11/2017  Calcium, 24H Urine     mg/24 h 197  Creatinine, 24H Ur     0.50 - 2.15 g/24 h 1.38   Reviewed the most recent DXA scan report showing osteopenia: Date L1-L4 T score FN T score FRAX  08/2016 ([facility]) +0.3 RFN: -1.8 RFN: -1.8 MOF: 10.5% 10-year risk of hip fracture: 1.7%   No fractures.  She fell and dislocated her shoulder approximately 6 years ago.  She has a history of kidney stones in 1969.  + CKD stage III per previous records. Last BUN/Cr: Lab Results  Component Value Date   BUN 7 (L) 08/23/2018   BUN 6 (L) 08/22/2018   CREATININE 0.83 08/23/2018   CREATININE 0.75 08/22/2018  01/25/2017: BUN/creatinine  14/0.82  She is not on HCTZ  She has a history of vitamin D deficiency.  Most recent vitamin D levels have been Lab Results  Component Value Date   VD25OH 54.73 12/28/2017   VD25OH 50.06 06/08/2017   01/25/2017: Vitamin D 48  States ergocalciferol 50,000 units every 2 weeks.  Pt does not have a FH of hypercalcemia,  pituitary tumors, thyroid cancer, or osteoporosis.   She also has a history of uncontrolled hypothyroidism. No results found for: TSH   ROS: Constitutional: no weight gain/no weight loss, no fatigue, no subjective hyperthermia, no subjective hypothermia Eyes: no blurry vision, no xerophthalmia ENT: no sore throat, no nodules palpated in neck, no dysphagia, no odynophagia, no hoarseness Cardiovascular: no CP/no SOB/no palpitations/no leg swelling Respiratory: no cough/no SOB/no wheezing Gastrointestinal: no N/no V/no D/no C/no acid reflux Musculoskeletal: + muscle aches/+ joint aches Skin: no rashes, no hair loss Neurological: no tremors/no numbness/no tingling/no  dizziness  I reviewed pt's medications, allergies, PMH, social hx, family hx, and changes were documented in the history of present illness. Otherwise, unchanged from my initial visit note.  Past Medical History:  Diagnosis Date  . Fibromyalgia   . GERD (gastroesophageal reflux disease)   . Hypertension   . Kidney stones 1982   Past Surgical History:  Procedure Laterality Date  . carpel tunnel  2000   right  . NOSE SURGERY  1998   reconstruction  . OVARIAN CYST SURGERY  2011  . ROTATOR CUFF REPAIR Right    Social History   Socioeconomic History  . Marital status: Single    Spouse name: Not on file  . Number of children: 2  . Years of education: 5  Social Needs  Occupational History  . Occupation: retired    Comment: Product/process development scientist  Tobacco Use  . Smoking status: Never Smoker  . Smokeless tobacco: Never Used  Substance and Sexual Activity  . Alcohol use: No  . Drug use: No  Social History Narrative   Lives at home alone   Drinks 16 oz tea daily   Current Outpatient Medications on File Prior to Visit  Medication Sig Dispense Refill  . amLODipine (NORVASC) 10 MG tablet Take 5 mg by mouth daily.     . calcium carbonate (TUMS - DOSED IN MG ELEMENTAL CALCIUM) 500 MG chewable tablet Chew 1 tablet (200 mg of elemental calcium total) by mouth 2 (two) times daily.    . feeding supplement, ENSURE ENLIVE, (ENSURE ENLIVE) LIQD Take 237 mLs by mouth 2 (two) times daily between meals. 237 mL 12  . gabapentin (NEURONTIN) 100 MG capsule Take 100 mg by mouth 3 (three) times daily.    Marland Kitchen HYDROcodone-acetaminophen (NORCO/VICODIN) 5-325 MG tablet Take 1 tablet by mouth every 6 (six) hours as needed for moderate pain. 15 tablet 0  . levothyroxine (SYNTHROID, LEVOTHROID) 75 MCG tablet Take 75 mcg by mouth daily before breakfast.     . Multiple Vitamin (MULTIVITAMIN WITH MINERALS) TABS tablet Take 1 tablet by mouth daily.    . Vitamin D, Ergocalciferol, (DRISDOL) 50000 UNITS CAPS  capsule Take 50,000 Units by mouth every 14 (fourteen) days.   5   No current facility-administered medications on file prior to visit.    Allergies  Allergen Reactions  . Amoxicillin Itching    Did it involve swelling of the face/tongue/throat, SOB, or low BP? Yes Did it involve sudden or severe rash/hives, skin peeling, or any reaction on the inside of your mouth or nose? No Did you need to seek medical attention at a hospital or doctor's office? No When did it last happen?unknown  If all above answers are "NO", may proceed with cephalosporin use.   . Carbidopa-Levodopa Itching   Family History  Problem Relation Age of Onset  . Healthy Mother   . Healthy Father     PE: There were no vitals taken  for this visit. Wt Readings from Last 3 Encounters:  08/18/18 145 lb (65.8 kg)  06/08/17 169 lb 6.4 oz (76.8 kg)  11/02/16 182 lb (82.6 kg)   Constitutional: overweight, in NAD; in wheelchair Eyes: PERRLA, EOMI, no exophthalmos ENT: moist mucous membranes, no thyromegaly, no cervical lymphadenopathy Cardiovascular: RRR, No MRG Respiratory: CTA B Gastrointestinal: abdomen soft, NT, ND, BS+ Musculoskeletal: no deformities, strength intact in all 4 Skin: moist, warm, no rashes Neurological: no tremor with outstretched hands, DTR normal in all 4  Assessment: 1.  Primary hyperparathyroidism  2.  History of vitamin D deficiency  Plan: Patient with history of elevated calcium, with the highest level being at 11.3.  A corresponding intact PTH level was not suppressed, at 45 at that time, and 43 at last visit.  She does have a history of vitamin D deficiency, however, at last check, she was not vitamin D deficient. -She has no apparent complications from her hypercalcemia.  She has a distant history of nephrolithiasis, no osteoporosis, but osteopenia, no fractures.  She denies abdominal pain, depression, bone pain.  She has constipation, though. -She had previous investigation  with a normal PTH RP and SPEP and, at last visit, we investigated her for primary hyperparathyroidism and the investigation was positive.  Although a magnesium, phosphorus, calcitriol, 25-hydroxy vitamin D and 24-hour urine urine calcium were normal, her calcium was elevated and her PTH was not suppressed.  Especially in the presence of osteopenia, I would still suggest an evaluation by surgery, however, due to her neurologic problems, patient and her family wanted to have her levels checked again in 6 months, at this visit before a treatment decision was made. -At this visit, we reviewed the labs from last visit and I explained that they were all normal with the exception of a slightly elevated calcium in the presence of a nonsuppressed PTH.  Because the condition is mild, per their preference, I would not refer her to surgery, but will recheck her levels today and then again in a year. -Today will recheck her calcium, intact PTH by the [facility] assay, and the 25 hydroxy vitamin D. -Criteria for surgery are: Increased calcium by more than 1 mg/dL above the upper limit of normal  Kidney ds.  Osteoporosis (or Vb fx) Age <55 years old Newer criteria (2013): High UCa >400 mg/d and increased stone risk by biochemical stone risk analysis Presence of nephrolithiasis or nephrocalcinosis Pt's preference!  - I will see the patient back in 1 year  2.  History of vitamin D deficiency -Reviewed most recent vitamin D level from last visit and this was normal -She continues on ergocalciferol 50,000 units every 2 weeks. -We will recheck today   Philemon Kingdom, MD PhD [facility] Endocrinology

## 2019-01-15 ENCOUNTER — Encounter: Payer: Self-pay | Admitting: Internal Medicine

## 2019-01-16 IMAGING — CT CT CERVICAL SPINE W/O CM
3 of 7 series · 12 of 33 positions shown, 13 images · non-contrast
Comparison: Cervical spine MRI dated 11/03/2014

CLINICAL DATA: 72-year-old female with fall.

EXAM:
CT HEAD WITHOUT CONTRAST
CT CERVICAL SPINE WITHOUT CONTRAST
TECHNIQUE: Multidetector CT imaging of the head and cervical spine was
performed following the standard protocol without intravenous
contrast. Multiplanar CT image reconstructions of the cervical spine
were also generated.

[Series 6: coronal soft tissue · coronal · 0.32mm/px · 3 of 71 slices shown]
[im 18/71  bone]
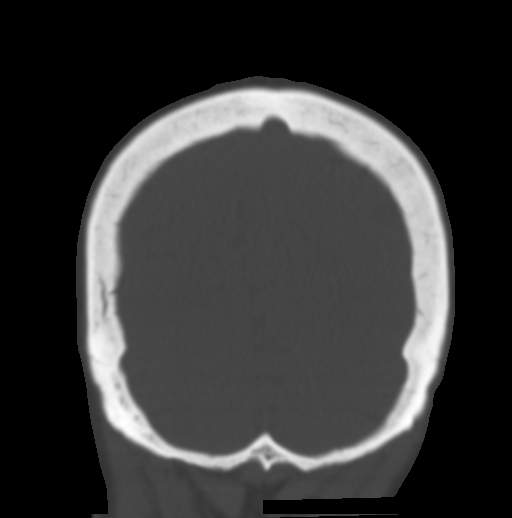
[im 36/71  bone]
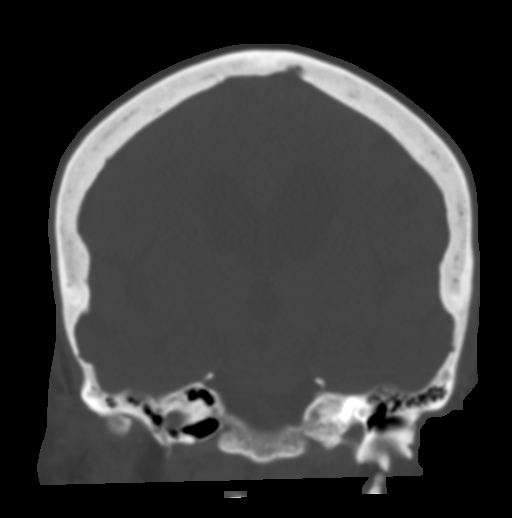
[im 53/71  bone]
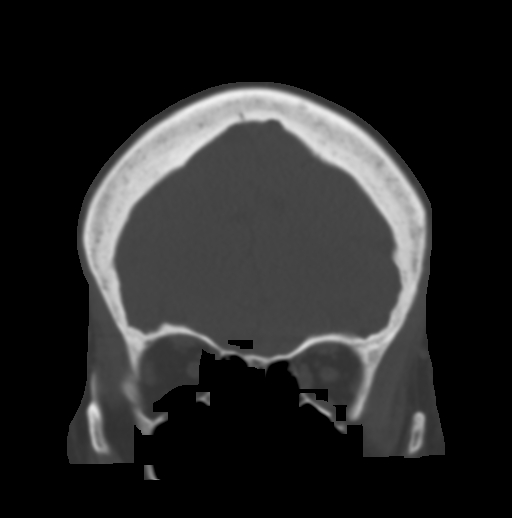

[Series 11: orthogonal bone · axial · 0.23mm/px · z∈[+916,+1056]mm · 4 of 127 slices shown, 5 images]
[im 22/127  soft-tissue]
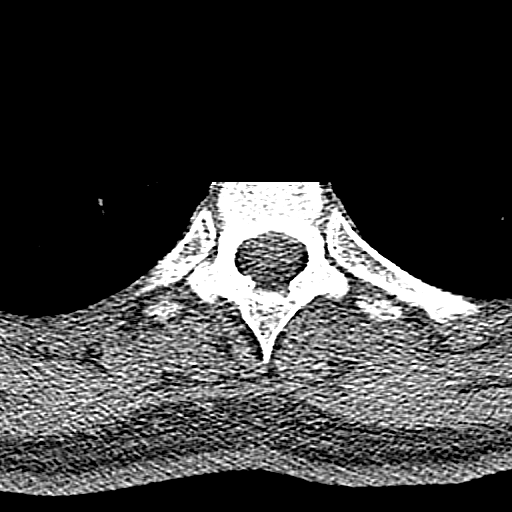
[im 22/127  bone]
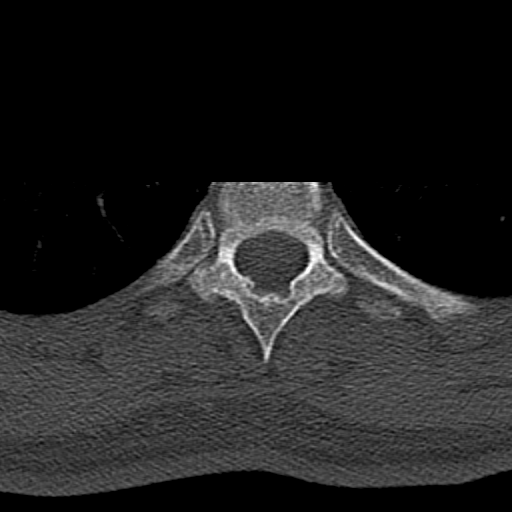
[im 43/127  bone]
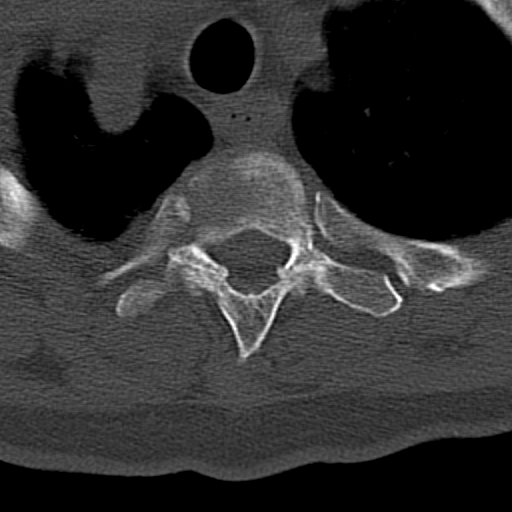
[im 85/127  bone]
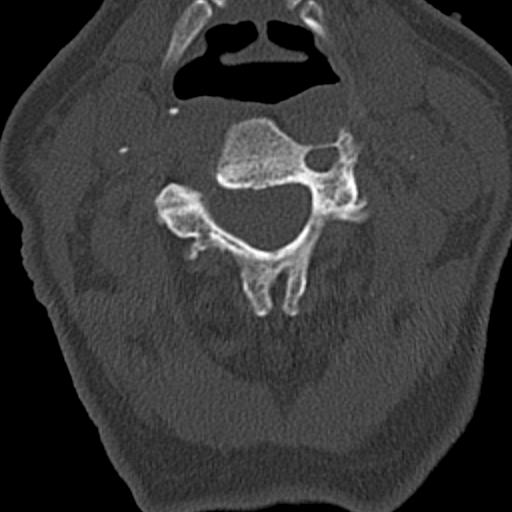
[im 106/127  bone]
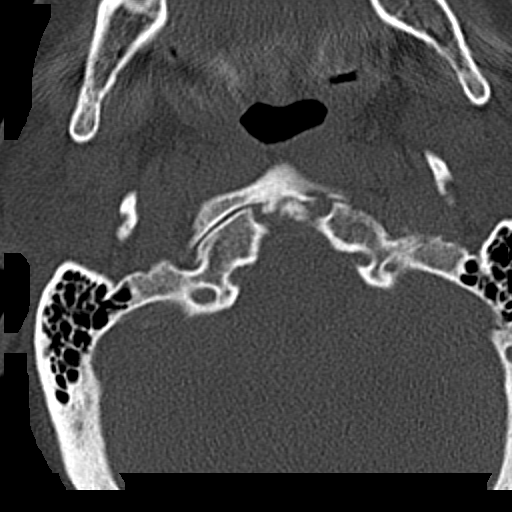

[Series 13: sagittal bone · sagittal · 0.23mm/px · 5 of 61 slices shown]
[im 11/61  bone]
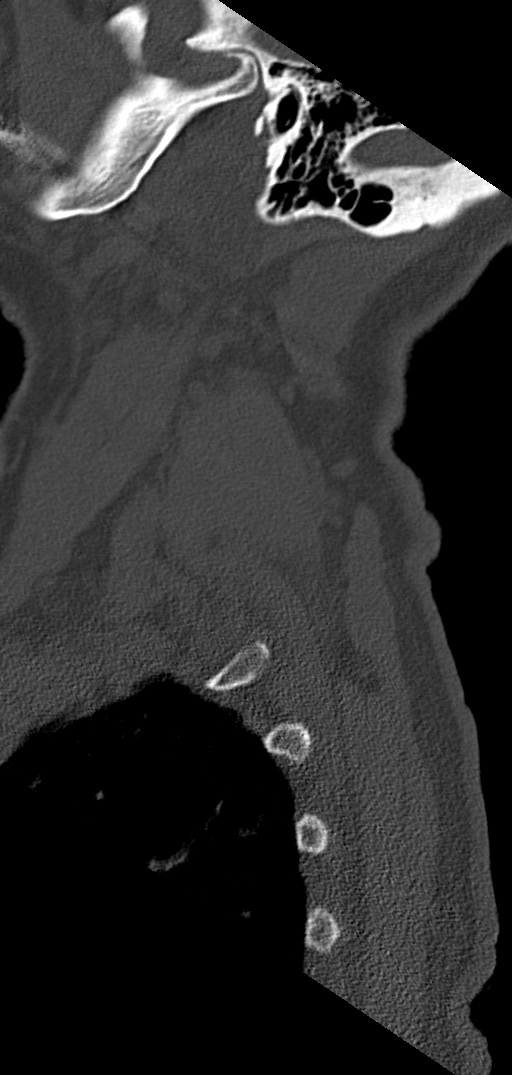
[im 21/61  bone]
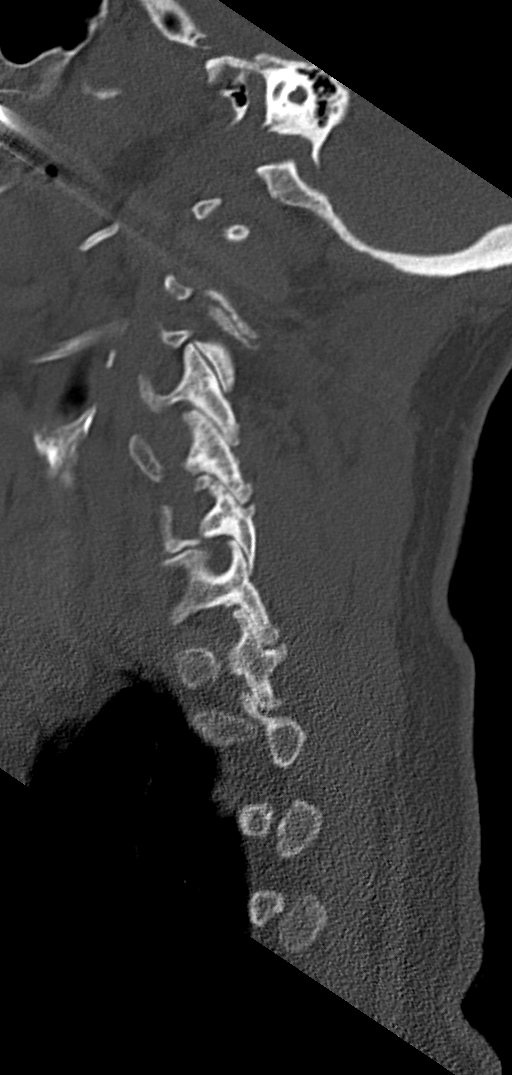
[im 31/61  bone]
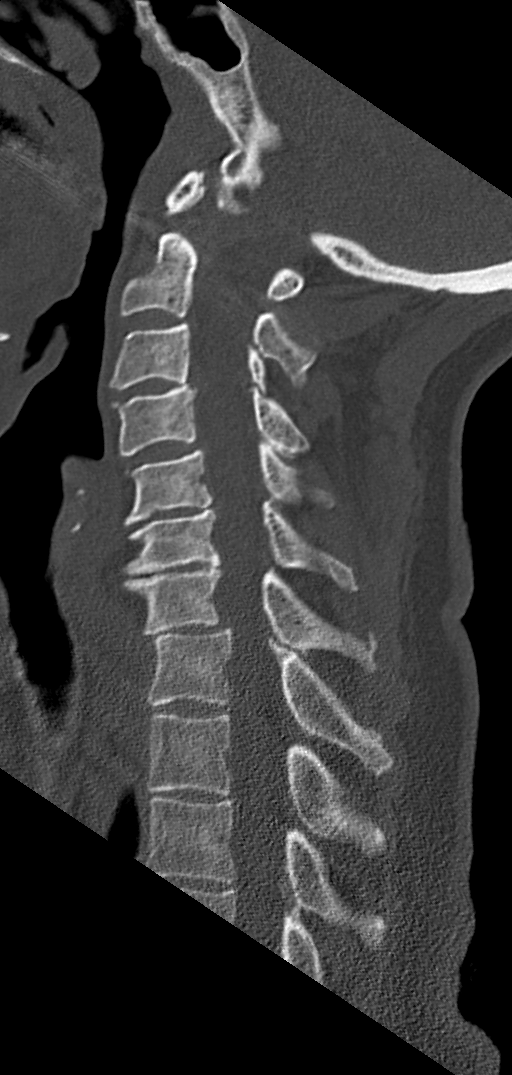
[im 41/61  bone]
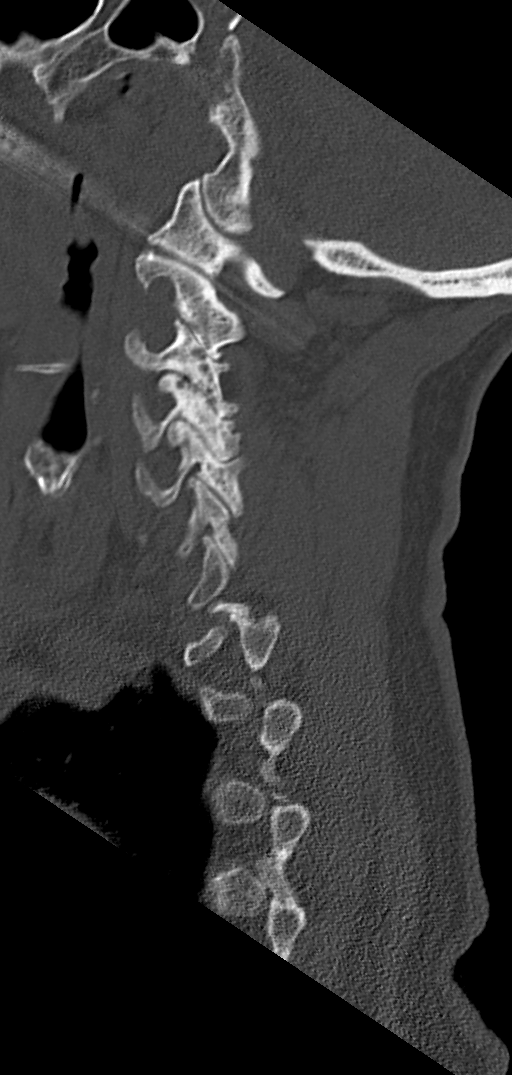
[im 51/61  bone]
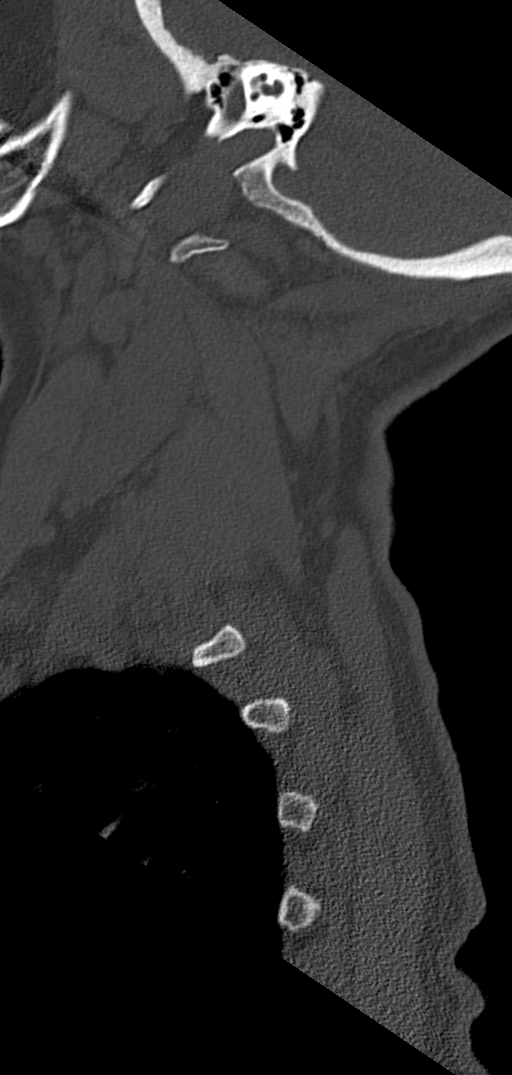

[12 of 33 positions shown; findings below may reference images not displayed]

FINDINGS: CT HEAD FINDINGS

Brain: The ventricles and sulci appropriate size for patient's age.
Mild periventricular and deep white matter chronic microvascular
ischemic changes noted. There is no acute intracranial hemorrhage.
No mass effect or midline shift noted. No extra-axial fluid
collection.

Vascular: No hyperdense vessel or unexpected calcification.

Skull: Normal. Negative for fracture or focal lesion.

Sinuses/Orbits: Mild mucoperiosteal thickening of paranasal sinuses.
No air-fluid levels. Postsurgical changes of maxillary sinuses. The
mastoid air cells are clear.

Other: None

CT CERVICAL SPINE FINDINGS

Alignment: No acute subluxation.  Grade 1 C7-T1 anterolisthesis.

Skull base and vertebrae: No acute fracture. No primary bone lesion
or focal pathologic process.

Soft tissues and spinal canal: No prevertebral fluid or swelling. No
visible canal hematoma.

Disc levels:  Multilevel degenerative changes and facet hypertrophy.

Upper chest: Negative.

Other: A 13 x 11 mm right thyroid hypodense nodule. Ultrasound may
provide better evaluation on the nonemergent basis.
IMPRESSION: 1. No acute intracranial hemorrhage.
2. No acute/traumatic cervical spine pathology.

## 2019-02-02 ENCOUNTER — Other Ambulatory Visit: Payer: Self-pay

## 2019-02-02 ENCOUNTER — Emergency Department (HOSPITAL_COMMUNITY): Payer: Medicare Other

## 2019-02-02 ENCOUNTER — Encounter (HOSPITAL_COMMUNITY): Payer: Self-pay

## 2019-02-02 ENCOUNTER — Emergency Department (HOSPITAL_COMMUNITY)
Admission: EM | Admit: 2019-02-02 | Discharge: 2019-02-02 | Disposition: A | Discharge status: Home / Self Care | Payer: Medicare Other | Attending: Emergency Medicine | Admitting: Emergency Medicine

## 2019-02-02 DIAGNOSIS — I10 Essential (primary) hypertension: Secondary | ICD-10-CM | POA: Diagnosis not present

## 2019-02-02 DIAGNOSIS — Z79899 Other long term (current) drug therapy: Secondary | ICD-10-CM | POA: Insufficient documentation

## 2019-02-02 DIAGNOSIS — R42 Dizziness and giddiness: Secondary | ICD-10-CM | POA: Insufficient documentation

## 2019-02-02 DIAGNOSIS — E876 Hypokalemia: Secondary | ICD-10-CM | POA: Diagnosis not present

## 2019-02-02 HISTORY — DX: Progressive supranuclear ophthalmoplegia (steele-Richardson-olszewski): G23.1

## 2019-02-02 LAB — COMPREHENSIVE METABOLIC PANEL
ALT: 9 U/L (ref 0–44)
AST: 15 U/L (ref 15–41)
Albumin: 3.8 g/dL (ref 3.5–5.0)
Alkaline Phosphatase: 83 U/L (ref 38–126)
Anion gap: 7 (ref 5–15)
BUN: 13 mg/dL (ref 8–23)
CO2: 29 mmol/L (ref 22–32)
Calcium: 10.5 mg/dL — ABNORMAL HIGH (ref 8.9–10.3)
Chloride: 104 mmol/L (ref 98–111)
Creatinine, Ser: 0.76 mg/dL (ref 0.44–1.00)
GFR calc Af Amer: >60 mL/min (ref >60)
GFR calc non Af Amer: >60 mL/min (ref >60)
Glucose, Bld: 124 mg/dL — ABNORMAL HIGH (ref 70–99)
Potassium: 2.7 mmol/L — CL (ref 3.5–5.1)
Sodium: 140 mmol/L (ref 135–145)
Total Bilirubin: 0.7 mg/dL (ref 0.3–1.2)
Total Protein: 7.5 g/dL (ref 6.5–8.1)

## 2019-02-02 LAB — LACTIC ACID, PLASMA: Lactic Acid, Venous: 1 mmol/L (ref 0.5–1.9)

## 2019-02-02 LAB — URINALYSIS, ROUTINE W REFLEX MICROSCOPIC
Bilirubin Urine: NEGATIVE
Glucose, UA: NEGATIVE mg/dL
Hgb urine dipstick: NEGATIVE
Ketones, ur: NEGATIVE mg/dL
Leukocytes,Ua: NEGATIVE
Nitrite: NEGATIVE
Protein, ur: NEGATIVE mg/dL
Specific Gravity, Urine: 1.009 (ref 1.005–1.030)
pH: 6 (ref 5.0–8.0)

## 2019-02-02 LAB — CBC
HCT: 31.5 % — ABNORMAL LOW (ref 36.0–46.0)
Hemoglobin: 8.9 g/dL — ABNORMAL LOW (ref 12.0–15.0)
MCH: 21.1 pg — ABNORMAL LOW (ref 26.0–34.0)
MCHC: 28.3 g/dL — ABNORMAL LOW (ref 30.0–36.0)
MCV: 74.6 fL — ABNORMAL LOW (ref 80.0–100.0)
Platelets: 252 10*3/uL (ref 150–400)
RBC: 4.22 MIL/uL (ref 3.87–5.11)
RDW: 16.2 % — ABNORMAL HIGH (ref 11.5–15.5)
WBC: 3.7 10*3/uL — ABNORMAL LOW (ref 4.0–10.5)
nRBC: 0 % (ref 0.0–0.2)

## 2019-02-02 LAB — CBG MONITORING, ED: Glucose-Capillary: 119 mg/dL — ABNORMAL HIGH (ref 70–99)

## 2019-02-02 MED ORDER — POTASSIUM CHLORIDE CRYS ER 20 MEQ PO TBCR: 20.0000 meq | EXTENDED_RELEASE_TABLET | Freq: Two times a day (BID) | ORAL | 0 refills | Status: AC

## 2019-02-02 MED ORDER — MAGNESIUM 30 MG PO TABS: 30.0000 mg | ORAL_TABLET | Freq: Two times a day (BID) | ORAL | 0 refills | Status: AC

## 2019-02-02 MED ORDER — POTASSIUM CHLORIDE 10 MEQ/100ML IV SOLN
10.0000 meq | Freq: Once | INTRAVENOUS | Status: AC
  Administered 2019-02-02: 10 meq via INTRAVENOUS
  Filled 2019-02-02: qty 100

## 2019-02-02 MED ORDER — POTASSIUM CHLORIDE CRYS ER 20 MEQ PO TBCR
40.0000 meq | EXTENDED_RELEASE_TABLET | Freq: Once | ORAL | Status: AC
  Administered 2019-02-02: 40 meq via ORAL
  Filled 2019-02-02: qty 2

## 2019-02-02 NOTE — ED Notes (Signed)
Pt daughter Otila Kluver) can be reached at [phone]

## 2019-02-02 NOTE — ED Notes (Signed)
Renita Papa @ [phone] (daughter)

## 2019-02-02 NOTE — Discharge Instructions (Signed)
Take potassium and magnesium supplements as prescribed. Have potassium rechecked in 1 week. Return if worsening.

## 2019-02-02 NOTE — ED Provider Notes (Signed)
[place] DEPT Provider Note   CSN: [id number] Arrival date & time: 02/02/19  1305     History   Chief Complaint Chief Complaint  Patient presents with  . Dizziness    HPI Heather Brady is a 73 y.o. female.     HPI  Heather Brady is a 73 y.o. female with hx of atypical parkinsons, Progressive supranuclear palsy,  fibromyalgia, HTN presents to ED with complaint of dizziness. Pt apparently was changing at home, trying to change her depends and was assisted by family member when suddenly she stated she was feeling dizzy. To the family she looked pale. She denied any chest pain or palpations at that time. She denied headache. She did report room was spinning. She asked for family to take her to the hospital. Pt states she is feeling better now. At baseline wheelchair bound.    Past Medical History:  Diagnosis Date  . Fibromyalgia   . GERD (gastroesophageal reflux disease)   . Hypertension   . Kidney stones 1982  . PSP (progressive supranuclear palsy) [facility])     Patient Active Problem List   Diagnosis Date Noted  . GI bleed 08/18/2018  . Hypokalemia 08/18/2018  . Anemia 08/18/2018  . GERD (gastroesophageal reflux disease) 08/18/2018  . HTN (hypertension) 08/18/2018  . H/O vitamin D deficiency 12/28/2017  . Hypercalcemia 06/08/2017  . Gait difficulty 10/20/2014  . Parkinsonism ([place]) 10/20/2014  . Dizziness and giddiness 10/20/2014    Past Surgical History:  Procedure Laterality Date  . carpel tunnel  2000   right  . NOSE SURGERY  1998   reconstruction  . OVARIAN CYST SURGERY  2011  . ROTATOR CUFF REPAIR Right      OB History   No obstetric history on file.      Home Medications    Prior to Admission medications   Medication Sig Start Date End Date Taking? Authorizing Provider  amLODipine (NORVASC) 10 MG tablet Take 5 mg by mouth daily.     [provider]  calcium carbonate (TUMS - DOSED IN MG ELEMENTAL CALCIUM)  500 MG chewable tablet Chew 1 tablet (200 mg of elemental calcium total) by mouth 2 (two) times daily. 08/24/18   Mikhail, Velta Addison, DO  feeding supplement, ENSURE ENLIVE, (ENSURE ENLIVE) LIQD Take 237 mLs by mouth 2 (two) times daily between meals. 08/24/18   Mikhail, Velta Addison, DO  gabapentin (NEURONTIN) 100 MG capsule Take 100 mg by mouth 3 (three) times daily. 07/16/18   [provider]  HYDROcodone-acetaminophen (NORCO/VICODIN) 5-325 MG tablet Take 1 tablet by mouth every 6 (six) hours as needed for moderate pain. 08/24/18   Cristal Ford, DO  levothyroxine (SYNTHROID, LEVOTHROID) 75 MCG tablet Take 75 mcg by mouth daily before breakfast.  10/06/14   [provider]  Multiple Vitamin (MULTIVITAMIN WITH MINERALS) TABS tablet Take 1 tablet by mouth daily. 08/24/18   Mikhail, Velta Addison, DO  Vitamin D, Ergocalciferol, (DRISDOL) 50000 UNITS CAPS capsule Take 50,000 Units by mouth every 14 (fourteen) days.  08/03/14   [provider]    Family History Family History  Problem Relation Age of Onset  . Healthy Mother   . Healthy Father     Social History Social History   Tobacco Use  . Smoking status: Never Smoker  . Smokeless tobacco: Never Used  Substance Use Topics  . Alcohol use: No  . Drug use: No     Allergies   Amoxicillin and Carbidopa-levodopa   Review of  Systems Review of Systems  Constitutional: Negative for chills and fever.  Respiratory: Negative for cough, chest tightness and shortness of breath.   Cardiovascular: Negative for chest pain, palpitations and leg swelling.  Gastrointestinal: Negative for abdominal pain, diarrhea, nausea and vomiting.  Genitourinary: Negative for dysuria and flank pain.  Musculoskeletal: Negative for arthralgias, myalgias, neck pain and neck stiffness.  Skin: Negative for rash.  Neurological: Positive for dizziness, weakness and light-headedness. Negative for headaches.  All other systems reviewed and are negative.     Physical Exam Updated Vital Signs BP (!) 166/94 (BP Location: Right Arm)   Pulse 97   Temp 98.1 F (36.7 C) (Rectal)   Resp 18   Ht 5\' 6"  (1.676 m)   Wt 79.4 kg   SpO2 97%   BMI 28.25 kg/m   Physical Exam Vitals signs and nursing note reviewed.  Constitutional:      General: She is not in acute distress.    Appearance: She is well-developed.  HENT:     Head: Normocephalic.  Eyes:     Extraocular Movements: Extraocular movements intact.     Conjunctiva/sclera: Conjunctivae normal.     Pupils: Pupils are equal, round, and reactive to light.  Neck:     Musculoskeletal: Neck supple.  Cardiovascular:     Rate and Rhythm: Normal rate and regular rhythm.     Heart sounds: Normal heart sounds.  Pulmonary:     Effort: Pulmonary effort is normal. No respiratory distress.     Breath sounds: Normal breath sounds. No wheezing or rales.  Abdominal:     General: Bowel sounds are normal. There is no distension.     Palpations: Abdomen is soft.     Tenderness: There is no abdominal tenderness. There is no rebound.  Skin:    General: Skin is warm and dry.  Neurological:     General: No focal deficit present.     Mental Status: She is alert and oriented to person, place, and time.     Comments: Slurred speech, right hand contracted. Little movement in legs. All at baseline  Psychiatric:        Mood and Affect: Mood normal.        Behavior: Behavior normal.      ED Treatments / Results  Labs (all labs ordered are listed, but only abnormal results are displayed) Labs Reviewed  COMPREHENSIVE METABOLIC PANEL - Abnormal; Notable for the following components:      Result Value   Potassium 2.7 (*)    Glucose, Bld 124 (*)    Calcium 10.5 (*)    All other components within normal limits  CBC - Abnormal; Notable for the following components:   WBC 3.7 (*)    Hemoglobin 8.9 (*)    HCT 31.5 (*)    MCV 74.6 (*)    MCH 21.1 (*)    MCHC 28.3 (*)    RDW 16.2 (*)    All other  components within normal limits  CBG MONITORING, ED - Abnormal; Notable for the following components:   Glucose-Capillary 119 (*)    All other components within normal limits  LACTIC ACID, PLASMA  URINALYSIS, ROUTINE W REFLEX MICROSCOPIC  LACTIC ACID, PLASMA    EKG EKG Interpretation  Date/Time:  Sunday February 02 2019 13:27:21 EDT Ventricular Rate:  99 PR Interval:    QRS Duration: 99 QT Interval:  372 QTC Calculation: 478 R Axis:   20 Text Interpretation:  Sinus or ectopic atrial rhythm Borderline  repolarization abnormality No acute changes Nonspecific ST and T wave abnormality No significant change since last tracing Confirmed by Derwood Kaplan [phone]) on 02/02/2019 4:26:48 PM   Radiology Ct Head Wo Contrast  Result Date: 02/02/2019 CLINICAL DATA:  Vertigo episode. EXAM: CT HEAD WITHOUT CONTRAST TECHNIQUE: Contiguous axial images were obtained from the base of the skull through the vertex without intravenous contrast. COMPARISON:  None. FINDINGS: Brain: No subdural, epidural, or subarachnoid hemorrhage. The ventricles and sulci are prominent. Cerebellum, brainstem, and basal cisterns are normal. No mass effect or midline shift. Scattered white matter changes are identified. No acute cortical ischemia or infarct is noted. Vascular: Calcified atherosclerosis is seen in the intracranial carotids. Skull: Normal. Negative for fracture or focal lesion. Sinuses/Orbits: No acute finding. Other: None. IMPRESSION: 1. No acute intracranial abnormalities identified. Electronically Signed   By: Gerome Sam III M.D   On: 02/02/2019 14:48    Procedures Procedures (including critical care time)  Medications Ordered in ED Medications - No data to display   Initial Impression / Assessment and Plan / ED Course  I have reviewed the triage vital signs and the nursing notes.  Pertinent labs & imaging results that were available during my care of the patient were reviewed by me and considered  in my medical decision making (see chart for details).   1:45 PM Pt seen and examined. Pt with sudden onset of room spinning and paleness while changing her depends at home. She is feeling better now. Spoke with daughter on the phone, this is very unusual for her mom. Would like her evaluated. Initial VS show HTN, 166/94, otherwise normal. No significant exam findings. Labs, urine, ct head ordered.   4:28 PM Labs show anemia, at baseline. Potassium 2.7. she received x2 IV and 40 PO. ECG without changes. Pt otherwise continues to be asymptomatic. Stable for dc home. Will start on potassium and magnesium at home.   Spoke with pt's daughter, updated. All questions answered.  Final Clinical Impressions(s) / ED Diagnoses   Final diagnoses:  Dizziness  Hypokalemia    ED Discharge Orders         Ordered    potassium chloride SA (K-DUR) 20 MEQ tablet  2 times daily     02/02/19 1705    magnesium 30 MG tablet  2 times daily     02/02/19 1705           Jaynie Crumble, PA-C 02/02/19 1707    Derwood Kaplan, MD 02/04/19 2017

## 2019-02-02 NOTE — ED Notes (Signed)
Pt daughter came in to the ED to assist with getting pt to wheelchair and in to car safely.

## 2019-02-02 NOTE — ED Notes (Signed)
Patient can only lift one arm with assistance and can not lift right arm at all. RN and NT will not and do not feel comfortable putting patient in wheelchair and then attempting to put patient in POV with family risking injury to staff and to patient.

## 2019-02-02 NOTE — ED Triage Notes (Signed)
Pt arrives from home via [facility] for c/o dizziness. Pt is bed bound, and has a 24 hour caregiver at home- dizziness began this AM when pt sat up.

## 2019-06-09 DEATH — deceased
